# Patient Record
Sex: Male | Born: 1995 | Race: Black or African American | Hispanic: No | Marital: Single | Smoking: Current every day smoker
Health system: Southern US, Community
[De-identification: ages and names within clinical notes are randomized; demographics above are authoritative.]

## PROBLEM LIST (undated history)

## (undated) DIAGNOSIS — F4310 Post-traumatic stress disorder, unspecified: Secondary | ICD-10-CM

## (undated) DIAGNOSIS — R569 Unspecified convulsions: Secondary | ICD-10-CM

## (undated) DIAGNOSIS — F3481 Disruptive mood dysregulation disorder: Secondary | ICD-10-CM

## (undated) DIAGNOSIS — F70 Mild intellectual disabilities: Secondary | ICD-10-CM

## (undated) DIAGNOSIS — F919 Conduct disorder, unspecified: Secondary | ICD-10-CM

---
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
## 2016-04-04 ENCOUNTER — Emergency Department
Admission: EM | Admit: 2016-04-04 | Discharge: 2016-04-04 | Disposition: A | Discharge status: Home / Self Care | Payer: Medicaid Other | Attending: Emergency Medicine | Admitting: Emergency Medicine

## 2016-04-04 ENCOUNTER — Encounter: Payer: Self-pay | Admitting: Emergency Medicine

## 2016-04-04 DIAGNOSIS — Y9389 Activity, other specified: Secondary | ICD-10-CM | POA: Insufficient documentation

## 2016-04-04 DIAGNOSIS — S6992XA Unspecified injury of left wrist, hand and finger(s), initial encounter: Secondary | ICD-10-CM | POA: Diagnosis present

## 2016-04-04 DIAGNOSIS — Z91010 Allergy to peanuts: Secondary | ICD-10-CM | POA: Diagnosis not present

## 2016-04-04 DIAGNOSIS — Z79899 Other long term (current) drug therapy: Secondary | ICD-10-CM | POA: Diagnosis not present

## 2016-04-04 DIAGNOSIS — Y999 Unspecified external cause status: Secondary | ICD-10-CM | POA: Diagnosis not present

## 2016-04-04 DIAGNOSIS — Y9289 Other specified places as the place of occurrence of the external cause: Secondary | ICD-10-CM | POA: Insufficient documentation

## 2016-04-04 DIAGNOSIS — S60411A Abrasion of left index finger, initial encounter: Secondary | ICD-10-CM | POA: Insufficient documentation

## 2016-04-04 DIAGNOSIS — W503XXA Accidental bite by another person, initial encounter: Secondary | ICD-10-CM

## 2016-04-04 MED ORDER — BACITRACIN ZINC 500 UNIT/GM EX OINT
TOPICAL_OINTMENT | Freq: Once | CUTANEOUS | Status: AC
  Administered 2016-04-04: 1 via TOPICAL
  Filled 2016-04-04: qty 0.9
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
## 2016-04-04 NOTE — ED Notes (Signed)
NAD noted at time of D/C. Pt's caregiver denies questions or concerns. Pt ambulatory to the lobby at this time.   
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
## 2016-04-04 NOTE — ED Notes (Signed)
Gavin PoundDeborah (caregiver)- [phone]

## 2016-04-17 ENCOUNTER — Emergency Department
Admission: EM | Admit: 2016-04-17 | Discharge: 2016-04-17 | Disposition: A | Discharge status: Home / Self Care | Payer: Medicaid Other | Attending: Emergency Medicine | Admitting: Emergency Medicine

## 2016-04-17 ENCOUNTER — Encounter: Payer: Self-pay | Admitting: Emergency Medicine

## 2016-04-17 DIAGNOSIS — Z79899 Other long term (current) drug therapy: Secondary | ICD-10-CM | POA: Insufficient documentation

## 2016-04-17 DIAGNOSIS — M79605 Pain in left leg: Secondary | ICD-10-CM

## 2016-04-17 MED ORDER — BACLOFEN 10 MG PO TABS: 10.0000 mg | ORAL_TABLET | Freq: Three times a day (TID) | ORAL | 0 refills | Status: AC

## 2016-04-17 NOTE — ED Triage Notes (Addendum)
Patient to ER for c/o left leg/hip pain. Denies any known injury.

## 2016-04-17 NOTE — ED Notes (Signed)

## 2016-04-17 NOTE — ED Provider Notes (Signed)
[facility] Emergency Department Provider Note   ____________________________________________   First MD Initiated Contact with Patient 04/17/16 1155     (approximate)  I have reviewed the triage vital signs and the nursing notes.   HISTORY  Chief Complaint Leg Pain    HPI Michael Pope is a 20 y.o. male presents will nonspecific left groin pain times one day. Patient states he does 50 pushups today and thinks he may have a muscle in his leg. Denies any direct trauma injury or falling.    Past Medical History:  Diagnosis Date  . Conduct disorder   . Disruptive mood dysregulation disorder (HCC)   . Mild intellectual disability   . PTSD (post-traumatic stress disorder)   . Seizures (HCC)     There are no active problems to display for this patient.   History reviewed. No pertinent surgical history.  Prior to Admission medications   Medication Sig Start Date End Date Taking? Authorizing Provider  baclofen (LIORESAL) 10 MG tablet Take 1 tablet (10 mg total) by mouth 3 (three) times daily. 04/17/16   Charmayne Sheerharles M Beers, PA-C  FLUoxetine (PROZAC) 10 MG capsule Take 10 mg by mouth daily.    Historical Provider, MD  GUAIFENESIN PO Take by mouth.    Historical Provider, MD  guanFACINE (TENEX) 2 MG tablet Take 2 mg by mouth 2 (two) times daily.    Historical Provider, MD  HYDROcodone-acetaminophen (NORCO/VICODIN) 5-325 MG tablet Take 1 tablet by mouth every 4 (four) hours as needed. 04/05/15   Hayden Rasmussenavid Mabe, NP  LAMOTRIGINE PO Take 400 mg by mouth 2 (two) times daily.     Historical Provider, MD  ondansetron (ZOFRAN ODT) 4 MG disintegrating tablet Take 1 tablet (4 mg total) by mouth every 8 (eight) hours as needed for nausea or vomiting. 01/27/15   Nani RavensAndrew M Wight, MD  polyethylene glycol-electrolytes (NULYTELY/GOLYTELY) 420 G solution Take 4,000 mLs by mouth once. 01/27/15   Ozella Rocksavid J Merrell, MD  traZODone (DESYREL) 100 MG tablet Take 100 mg by mouth at  bedtime.    Historical Provider, MD    Allergies Almond oil; Fish allergy; Peanuts [peanut oil]; and Pollen extract  No family history on file.  Social History Social History  Substance Use Topics  . Smoking status: Never Smoker  . Smokeless tobacco: Never Used  . Alcohol use No    Review of Systems Constitutional: No fever/chills Eyes: No visual changes. ENT: No sore throat. Cardiovascular: Denies chest pain. Respiratory: Denies shortness of breath. Gastrointestinal: No abdominal pain.  No nausea, no vomiting.  No diarrhea.  No constipation. Genitourinary: Negative for dysuria. Musculoskeletal: Positive for left groin pain. Skin: Negative for rash. Neurological: Negative for headaches, focal weakness or numbness.  10-point ROS otherwise negative.  ____________________________________________   PHYSICAL EXAM:  VITAL SIGNS: ED Triage Vitals  Enc Vitals Group     BP 04/17/16 1138 135/79     Pulse Rate 04/17/16 1138 83     Resp 04/17/16 1138 18     Temp 04/17/16 1138 98.1 F (36.7 C)     Temp Source 04/17/16 1138 Oral     SpO2 04/17/16 1138 100 %     Weight 04/17/16 1139 145 lb (65.8 kg)     Height 04/17/16 1139 5\' 8"  (1.727 m)     Head Circumference --      Peak Flow --      Pain Score 04/17/16 1139 8     Pain Loc --  Pain Edu? --      Excl. in GC? --     Constitutional: Alert and oriented. Well appearing and in no acute distress. Eyes: Conjunctivae are normal. PERRL. EOMI. Head: Atraumatic. Nose: No congestion/rhinnorhea. Mouth/Throat: Mucous membranes are moist.  Oropharynx non-erythematous. Neck: No stridor.   Cardiovascular: Normal rate, regular rhythm. Grossly normal heart sounds.  Good peripheral circulation. Respiratory: Normal respiratory effort.  No retractions. Lungs CTAB. Gastrointestinal: Soft and nontender. No distention. No abdominal bruits. No CVA tenderness. Musculoskeletal: Left leg with straight leg raise is negative. No pelvic  tenderness on a rock. Neurovascularly intact. No ecchymosis or bruising. Neurologic:  Normal speech and language. No gross focal neurologic deficits are appreciated. No gait instability. Skin:  Skin is warm, dry and intact. No rash noted. Psychiatric: Mood and affect are normal. Speech and behavior are normal.  ____________________________________________   LABS (all labs ordered are listed, but only abnormal results are displayed)  Labs Reviewed - No data to display ____________________________________________  EKG   ____________________________________________  RADIOLOGY  Deferred at this visit. ____________________________________________   PROCEDURES  Procedure(s) performed: None  Procedures  Critical Care performed: No  ____________________________________________   INITIAL IMPRESSION / ASSESSMENT AND PLAN / ED COURSE  Pertinent labs & imaging results that were available during my care of the patient were reviewed by me and considered in my medical decision making (see chart for details).  Acute left leg pelvic strain. Rx given for baclofen 10 mg 3 times a day to use as needed for pain and discomfort. Patient follow-up with PCP or return to the ER with any worsening symptomology. Patient voices no other emergency medical complaints at this visit.  Clinical Course      ____________________________________________   FINAL CLINICAL IMPRESSION(S) / ED DIAGNOSES  Final diagnoses:  Left leg pain      NEW MEDICATIONS STARTED DURING THIS VISIT:  Discharge Medication List as of 04/17/2016 12:20 PM    START taking these medications   Details  baclofen (LIORESAL) 10 MG tablet Take 1 tablet (10 mg total) by mouth 3 (three) times daily., Starting Sat 04/17/2016, Print         Note:  This document was prepared using Dragon voice recognition software and may include unintentional dictation errors.   Evangeline Dakinharles M Beers, PA-C 04/17/16 1321    Emily FilbertJonathan E  Williams, MD 04/17/16 [phone]

## 2016-04-21 ENCOUNTER — Emergency Department
Admission: EM | Admit: 2016-04-21 | Discharge: 2016-04-21 | Disposition: A | Discharge status: Home / Self Care | Payer: Medicaid Other | Attending: Emergency Medicine | Admitting: Emergency Medicine

## 2016-04-21 DIAGNOSIS — Z91010 Allergy to peanuts: Secondary | ICD-10-CM | POA: Insufficient documentation

## 2016-04-21 DIAGNOSIS — F4325 Adjustment disorder with mixed disturbance of emotions and conduct: Secondary | ICD-10-CM | POA: Diagnosis not present

## 2016-04-21 DIAGNOSIS — F918 Other conduct disorders: Secondary | ICD-10-CM | POA: Insufficient documentation

## 2016-04-21 DIAGNOSIS — Z79899 Other long term (current) drug therapy: Secondary | ICD-10-CM | POA: Insufficient documentation

## 2016-04-21 DIAGNOSIS — R4689 Other symptoms and signs involving appearance and behavior: Secondary | ICD-10-CM

## 2016-04-21 DIAGNOSIS — Z046 Encounter for general psychiatric examination, requested by authority: Secondary | ICD-10-CM | POA: Diagnosis present

## 2016-04-21 LAB — URINE DRUG SCREEN, QUALITATIVE (ARMC ONLY)
Amphetamines, Ur Screen: NOT DETECTED
Barbiturates, Ur Screen: NOT DETECTED
Benzodiazepine, Ur Scrn: NOT DETECTED
COCAINE METABOLITE, UR ~~LOC~~: NOT DETECTED
Cannabinoid 50 Ng, Ur ~~LOC~~: NOT DETECTED
MDMA (ECSTASY) UR SCREEN: NOT DETECTED
METHADONE SCREEN, URINE: NOT DETECTED
Opiate, Ur Screen: NOT DETECTED
Phencyclidine (PCP) Ur S: NOT DETECTED
TRICYCLIC, UR SCREEN: NOT DETECTED

## 2016-04-21 LAB — COMPREHENSIVE METABOLIC PANEL
ALT: 19 U/L (ref 17–63)
ANION GAP: 6 (ref 5–15)
AST: 26 U/L (ref 15–41)
Albumin: 4.8 g/dL (ref 3.5–5.0)
Alkaline Phosphatase: 103 U/L (ref 38–126)
BUN: 10 mg/dL (ref 6–20)
CHLORIDE: 110 mmol/L (ref 101–111)
CO2: 25 mmol/L (ref 22–32)
Calcium: 9.6 mg/dL (ref 8.9–10.3)
Creatinine, Ser: 0.84 mg/dL (ref 0.61–1.24)
GFR calc non Af Amer: >60 mL/min (ref >60)
Glucose, Bld: 98 mg/dL (ref 65–99)
POTASSIUM: 4.1 mmol/L (ref 3.5–5.1)
SODIUM: 141 mmol/L (ref 135–145)
Total Bilirubin: 0.5 mg/dL (ref 0.3–1.2)
Total Protein: 7.7 g/dL (ref 6.5–8.1)

## 2016-04-21 LAB — CBC
HCT: 40.2 % (ref 40.0–52.0)
HEMOGLOBIN: 13.7 g/dL (ref 13.0–18.0)
MCH: 31 pg (ref 26.0–34.0)
MCHC: 34.1 g/dL (ref 32.0–36.0)
MCV: 90.9 fL (ref 80.0–100.0)
PLATELETS: 202 10*3/uL (ref 150–440)
RBC: 4.43 MIL/uL (ref 4.40–5.90)
RDW: 13 % (ref 11.5–14.5)
WBC: 5.6 10*3/uL (ref 3.8–10.6)

## 2016-04-21 LAB — ACETAMINOPHEN LEVEL

## 2016-04-21 LAB — ETHANOL

## 2016-04-21 LAB — SALICYLATE LEVEL
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
## 2016-04-21 NOTE — ED Notes (Signed)
Pt eating dinner

## 2016-04-21 NOTE — ED Notes (Signed)
Pt dressed out in paper scrubs. Pt belongings including clothing and wallet bagged and labeled by this Clinical research associatewriter and Paulette, NT.

## 2016-04-21 NOTE — Discharge Instructions (Signed)
You have been seen in the Emergency Department (ED)  today for a psychiatric complaint.  You have been evaluated by psychiatry and we believe you are safe to be discharged from the hospital.   ° °Please return to the Emergency Department (ED)  immediately if you have ANY thoughts of hurting yourself or anyone else, so that we may help you. ° °Please avoid alcohol and drug use. ° °Follow up with your doctor and/or therapist as soon as possible regarding today's ED  visit.  ° °You may call crisis hotline for [place] at [phone]. ° °

## 2016-04-21 NOTE — ED Notes (Signed)
Dr clapacs with pt now.   
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
## 2016-05-11 ENCOUNTER — Emergency Department
Admission: EM | Admit: 2016-05-11 | Discharge: 2016-05-12 | Disposition: A | Discharge status: Home / Self Care | Payer: Medicaid Other | Attending: Emergency Medicine | Admitting: Emergency Medicine

## 2016-05-11 ENCOUNTER — Encounter: Payer: Self-pay | Admitting: Emergency Medicine

## 2016-05-11 DIAGNOSIS — F918 Other conduct disorders: Secondary | ICD-10-CM | POA: Insufficient documentation

## 2016-05-11 DIAGNOSIS — Z791 Long term (current) use of non-steroidal anti-inflammatories (NSAID): Secondary | ICD-10-CM | POA: Diagnosis not present

## 2016-05-11 DIAGNOSIS — Z91010 Allergy to peanuts: Secondary | ICD-10-CM | POA: Insufficient documentation

## 2016-05-11 DIAGNOSIS — F919 Conduct disorder, unspecified: Secondary | ICD-10-CM

## 2016-05-11 DIAGNOSIS — Z79899 Other long term (current) drug therapy: Secondary | ICD-10-CM | POA: Diagnosis not present

## 2016-05-11 DIAGNOSIS — R4689 Other symptoms and signs involving appearance and behavior: Secondary | ICD-10-CM

## 2016-05-11 LAB — ETHANOL: Alcohol, Ethyl (B): <5 mg/dL (ref <5)

## 2016-05-11 LAB — CBC WITH DIFFERENTIAL/PLATELET
BASOS ABS: 0.1 10*3/uL (ref 0–0.1)
Basophils Relative: 1 %
EOS PCT: 1 %
Eosinophils Absolute: 0.1 10*3/uL (ref 0–0.7)
HEMATOCRIT: 42 % (ref 40.0–52.0)
HEMOGLOBIN: 14.2 g/dL (ref 13.0–18.0)
LYMPHS ABS: 2.2 10*3/uL (ref 1.0–3.6)
LYMPHS PCT: 30 %
MCH: 30.6 pg (ref 26.0–34.0)
MCHC: 33.8 g/dL (ref 32.0–36.0)
MCV: 90.4 fL (ref 80.0–100.0)
Monocytes Absolute: 0.6 10*3/uL (ref 0.2–1.0)
Monocytes Relative: 8 %
NEUTROS ABS: 4.4 10*3/uL (ref 1.4–6.5)
NEUTROS PCT: 60 %
Platelets: 222 10*3/uL (ref 150–440)
RBC: 4.64 MIL/uL (ref 4.40–5.90)
RDW: 13 % (ref 11.5–14.5)
WBC: 7.4 10*3/uL (ref 3.8–10.6)

## 2016-05-11 LAB — COMPREHENSIVE METABOLIC PANEL
ALT: 51 U/L (ref 17–63)
AST: 44 U/L — AB (ref 15–41)
Albumin: 5.2 g/dL — ABNORMAL HIGH (ref 3.5–5.0)
Alkaline Phosphatase: 107 U/L (ref 38–126)
Anion gap: 7 (ref 5–15)
BUN: 9 mg/dL (ref 6–20)
CHLORIDE: 104 mmol/L (ref 101–111)
CO2: 27 mmol/L (ref 22–32)
CREATININE: 0.8 mg/dL (ref 0.61–1.24)
Calcium: 9.6 mg/dL (ref 8.9–10.3)
GFR calc non Af Amer: >60 mL/min (ref >60)
Glucose, Bld: 94 mg/dL (ref 65–99)
POTASSIUM: 3.5 mmol/L (ref 3.5–5.1)
SODIUM: 138 mmol/L (ref 135–145)
Total Bilirubin: 0.2 mg/dL — ABNORMAL LOW (ref 0.3–1.2)
Total Protein: 7.8 g/dL (ref 6.5–8.1)

## 2016-05-11 LAB — URINE DRUG SCREEN, QUALITATIVE (ARMC ONLY)
AMPHETAMINES, UR SCREEN: NOT DETECTED
Barbiturates, Ur Screen: NOT DETECTED
Benzodiazepine, Ur Scrn: NOT DETECTED
CANNABINOID 50 NG, UR ~~LOC~~: NOT DETECTED
COCAINE METABOLITE, UR ~~LOC~~: NOT DETECTED
MDMA (ECSTASY) UR SCREEN: NOT DETECTED
Methadone Scn, Ur: NOT DETECTED
OPIATE, UR SCREEN: NOT DETECTED
PHENCYCLIDINE (PCP) UR S: NOT DETECTED
Tricyclic, Ur Screen: NOT DETECTED

## 2016-05-11 NOTE — ED Triage Notes (Signed)
Pt presents to ED accompanied by group home staff with concerns about pt threatening, and acting aggressively with multiple angry outburst the past 2 days. Pt has been a this group home for the past 6 weeks with similar outburst reported. Pt alert and calm at this time and states he may need a medication change to help control his anger.
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
## 2016-05-12 LAB — ACETAMINOPHEN LEVEL

## 2016-05-12 LAB — SALICYLATE LEVEL: Salicylate Lvl: <7 mg/dL (ref 2.8–30.0)

## 2016-05-12 MED ORDER — FLUOXETINE HCL 20 MG PO CAPS: 20.0000 mg | ORAL_CAPSULE | Freq: Every day | ORAL | 0 refills | Status: AC

## 2016-05-12 MED ORDER — FLUOXETINE HCL 20 MG PO CAPS
20.0000 mg | ORAL_CAPSULE | Freq: Once | ORAL | Status: AC
  Administered 2016-05-12: 20 mg via ORAL

## 2016-05-12 MED ORDER — FLUOXETINE HCL 20 MG PO CAPS
ORAL_CAPSULE | ORAL | Status: AC
  Filled 2016-05-12: qty 1
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
## 2016-05-12 NOTE — ED Notes (Signed)
Group home is on the way to pick up pt. Discharge paperwork reviewed with pt.  Maintained on 15 minute checks and observation by security camera for safety.

## 2016-05-12 NOTE — ED Notes (Signed)
Pt dressing for discharge. Belongings searched prior to being given to pt.
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
## 2016-05-12 NOTE — ED Notes (Signed)
SOC consult completed  
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
## 2016-05-12 NOTE — ED Notes (Signed)
Pt discharged to group home. All belongings returned to pt.

## 2016-05-12 NOTE — ED Notes (Signed)
Pt laying in bed upon approach. Pt presents with sad affect. Denies SI/HI and AVH. Denies pain. Pt made aware of plans to discharge him back to group home. Pt accepting. No concerns voiced. Maintained on 15 minute checks and observation by security camera for safety.

## 2016-05-12 NOTE — ED Notes (Signed)
Patient assigned to appropriate care area. Patient oriented to unit/care area: Informed that, for their safety, care areas are designed for safety and monitored by security cameras at all times; and visiting hours explained to patient. Patient verbalizes understanding, and verbal contract for safety obtained. 

## 2016-05-12 NOTE — Discharge Instructions (Signed)
You were seen by tele-psychiatry who recommends increasing Prozac to 20 mg daily. Return to the ER for worsening symptoms, feelings of hurting herself or others, or other concerns.

## 2016-05-27 ENCOUNTER — Encounter: Payer: Self-pay | Admitting: Emergency Medicine

## 2016-05-27 ENCOUNTER — Emergency Department
Admission: EM | Admit: 2016-05-27 | Discharge: 2016-05-28 | Disposition: A | Discharge status: Home / Self Care | Payer: Medicaid Other | Attending: Student in an Organized Health Care Education/Training Program | Admitting: Student in an Organized Health Care Education/Training Program

## 2016-05-27 DIAGNOSIS — Z5181 Encounter for therapeutic drug level monitoring: Secondary | ICD-10-CM | POA: Insufficient documentation

## 2016-05-27 DIAGNOSIS — R4689 Other symptoms and signs involving appearance and behavior: Secondary | ICD-10-CM

## 2016-05-27 DIAGNOSIS — Z046 Encounter for general psychiatric examination, requested by authority: Secondary | ICD-10-CM | POA: Diagnosis present

## 2016-05-27 DIAGNOSIS — F918 Other conduct disorders: Secondary | ICD-10-CM | POA: Diagnosis not present

## 2016-05-27 LAB — CBC
HCT: 41.4 % (ref 40.0–52.0)
Hemoglobin: 14 g/dL (ref 13.0–18.0)
MCH: 30.7 pg (ref 26.0–34.0)
MCHC: 33.8 g/dL (ref 32.0–36.0)
MCV: 90.7 fL (ref 80.0–100.0)
PLATELETS: 213 10*3/uL (ref 150–440)
RBC: 4.56 MIL/uL (ref 4.40–5.90)
RDW: 12.8 % (ref 11.5–14.5)
WBC: 8.4 10*3/uL (ref 3.8–10.6)

## 2016-05-27 LAB — URINE DRUG SCREEN, QUALITATIVE (ARMC ONLY)
Amphetamines, Ur Screen: NOT DETECTED
BARBITURATES, UR SCREEN: NOT DETECTED
Benzodiazepine, Ur Scrn: NOT DETECTED
CANNABINOID 50 NG, UR ~~LOC~~: NOT DETECTED
Cocaine Metabolite,Ur ~~LOC~~: NOT DETECTED
MDMA (Ecstasy)Ur Screen: NOT DETECTED
METHADONE SCREEN, URINE: NOT DETECTED
Opiate, Ur Screen: NOT DETECTED
Phencyclidine (PCP) Ur S: NOT DETECTED
TRICYCLIC, UR SCREEN: NOT DETECTED

## 2016-05-27 LAB — COMPREHENSIVE METABOLIC PANEL
ALBUMIN: 5.2 g/dL — AB (ref 3.5–5.0)
ALK PHOS: 99 U/L (ref 38–126)
ALT: 24 U/L (ref 17–63)
ANION GAP: 7 (ref 5–15)
AST: 27 U/L (ref 15–41)
BILIRUBIN TOTAL: 0.7 mg/dL (ref 0.3–1.2)
BUN: 9 mg/dL (ref 6–20)
CALCIUM: 9.7 mg/dL (ref 8.9–10.3)
CO2: 26 mmol/L (ref 22–32)
Chloride: 107 mmol/L (ref 101–111)
Creatinine, Ser: 0.99 mg/dL (ref 0.61–1.24)
GFR calc non Af Amer: >60 mL/min (ref >60)
GLUCOSE: 105 mg/dL — AB (ref 65–99)
POTASSIUM: 4.1 mmol/L (ref 3.5–5.1)
Sodium: 140 mmol/L (ref 135–145)
TOTAL PROTEIN: 7.7 g/dL (ref 6.5–8.1)

## 2016-05-27 LAB — ETHANOL

## 2016-05-27 LAB — SALICYLATE LEVEL

## 2016-05-27 LAB — ACETAMINOPHEN LEVEL

## 2016-05-27 NOTE — ED Notes (Addendum)
ACSO to check group home to come get pt. D/t Group Home refusing to pick up pt for d/c.

## 2016-05-27 NOTE — Discharge Instructions (Signed)
Please follow up with [facility].  Return for any concerns or questions.
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
## 2016-05-27 NOTE — ED Notes (Signed)
Kennedy Buckerhanh, EDT in triage for protocols and to change pt into behav scrubs
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
## 2016-05-28 NOTE — ED Notes (Signed)
Pt. Verbalizes understanding of d/c instructions and follow-up. VS stable and pain controlled per pt.  Pt. In NAD at time of d/c and denies further concerns regarding this visit. Pt. Stable at the time of departure from the unit, departing unit by the safest and most appropriate manner per that pt condition and limitations. Pt advised to return to the ED at any time for emergent concerns, or for new/worsening symptoms.   

## 2016-05-28 NOTE — ED Notes (Addendum)
Pt given clothing to change for discharge per Rn

## 2016-05-28 NOTE — ED Notes (Signed)
Patient discharged to home per MD order. Patient in stable condition, and deemed medically cleared by ED provider for discharge. Discharge instructions reviewed with patient/family using "Teach Back"; verbalized understanding of medication education and administration, and information about follow-up care. Denies further concerns. ° °

## 2016-07-15 ENCOUNTER — Encounter: Payer: Self-pay | Admitting: *Deleted

## 2016-07-15 ENCOUNTER — Emergency Department: Payer: Medicaid Other

## 2016-07-15 ENCOUNTER — Emergency Department
Admission: EM | Admit: 2016-07-15 | Discharge: 2016-07-16 | Disposition: A | Discharge status: Home / Self Care | Payer: Medicaid Other | Attending: Emergency Medicine | Admitting: Emergency Medicine

## 2016-07-15 DIAGNOSIS — G40909 Epilepsy, unspecified, not intractable, without status epilepticus: Secondary | ICD-10-CM | POA: Diagnosis not present

## 2016-07-15 DIAGNOSIS — Z79899 Other long term (current) drug therapy: Secondary | ICD-10-CM | POA: Insufficient documentation

## 2016-07-15 DIAGNOSIS — F445 Conversion disorder with seizures or convulsions: Secondary | ICD-10-CM

## 2016-07-15 LAB — COMPREHENSIVE METABOLIC PANEL
ALBUMIN: 4.9 g/dL (ref 3.5–5.0)
ALT: 23 U/L (ref 17–63)
ANION GAP: 8 (ref 5–15)
AST: 28 U/L (ref 15–41)
Alkaline Phosphatase: 100 U/L (ref 38–126)
BUN: 10 mg/dL (ref 6–20)
CALCIUM: 9.2 mg/dL (ref 8.9–10.3)
CO2: 24 mmol/L (ref 22–32)
CREATININE: 1.04 mg/dL (ref 0.61–1.24)
Chloride: 107 mmol/L (ref 101–111)
GFR calc Af Amer: >60 mL/min (ref >60)
GFR calc non Af Amer: >60 mL/min (ref >60)
GLUCOSE: 108 mg/dL — AB (ref 65–99)
Potassium: 3.4 mmol/L — ABNORMAL LOW (ref 3.5–5.1)
SODIUM: 139 mmol/L (ref 135–145)
Total Bilirubin: 0.8 mg/dL (ref 0.3–1.2)
Total Protein: 7.6 g/dL (ref 6.5–8.1)

## 2016-07-15 LAB — CBC WITH DIFFERENTIAL/PLATELET
BASOS ABS: 0.1 10*3/uL (ref 0–0.1)
BASOS PCT: 1 %
EOS ABS: 0.1 10*3/uL (ref 0–0.7)
Eosinophils Relative: 1 %
HCT: 37.8 % — ABNORMAL LOW (ref 40.0–52.0)
HEMOGLOBIN: 13.3 g/dL (ref 13.0–18.0)
Lymphocytes Relative: 26 %
Lymphs Abs: 2.1 10*3/uL (ref 1.0–3.6)
MCH: 30.9 pg (ref 26.0–34.0)
MCHC: 35.3 g/dL (ref 32.0–36.0)
MCV: 87.5 fL (ref 80.0–100.0)
Monocytes Absolute: 0.7 10*3/uL (ref 0.2–1.0)
Monocytes Relative: 9 %
NEUTROS PCT: 63 %
Neutro Abs: 5.1 10*3/uL (ref 1.4–6.5)
Platelets: 187 10*3/uL (ref 150–440)
RBC: 4.33 MIL/uL — AB (ref 4.40–5.90)
RDW: 12.6 % (ref 11.5–14.5)
WBC: 8.1 10*3/uL (ref 3.8–10.6)

## 2016-07-15 LAB — ETHANOL: Alcohol, Ethyl (B): <5 mg/dL (ref <5)

## 2016-07-15 NOTE — ED Triage Notes (Signed)
Pt brought in via ems from [facility] group home.  Pt reportedly had a seizure 2 weeks ago and again tonight lasting 20 minutes.   On arrival to er treatment room pt awake and alert.  md at bedside.  Iv fluids infusing.

## 2016-07-15 NOTE — ED Provider Notes (Addendum)
[facility] Emergency Department Provider Note        Time seen: ----------------------------------------- 9:11 PM on 07/15/2016 -----------------------------------------    I have reviewed the triage vital signs and the nursing notes.   HISTORY  Chief Complaint Seizures    HPI Michael Pope is a 21 y.o. male who presents to ER being brought from a group home for possible seizure. Patient reportedly had a seizure like event 2 weeks ago and again tonight lasted around 20 minutes. On arrivalpatient was awake and alert. He denies any complaints at this time.   Past Medical History:  Diagnosis Date  . Conduct disorder   . Disruptive mood dysregulation disorder (HCC)   . Mild intellectual disability   . PTSD (post-traumatic stress disorder)   . Seizures [facility](HCC)     Patient Active Problem List   Diagnosis Date Noted  . 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  Allergies Almond oil; Fish allergy; Peanuts [peanut oil]; and Pollen extract  Social History Social History  Substance Use Topics  . Smoking status: Never Smoker  . Smokeless tobacco: Never Used  . Alcohol use No    Review of Systems Constitutional: Negative for fever. Cardiovascular: Negative for chest pain. Respiratory: Negative for shortness of breath. Gastrointestinal: Negative for abdominal pain, vomiting and diarrhea. Skin: Negative for rash. Neurological: Negative for headaches, focal weakness or numbness.Positive for possible seizure  10-point ROS otherwise negative.  ____________________________________________   PHYSICAL EXAM:  VITAL SIGNS: ED Triage Vitals  Enc Vitals Group     BP      Pulse      Resp      Temp      Temp src      SpO2      Weight      Height      Head Circumference      Peak Flow      Pain Score      Pain Loc      Pain Edu?      Excl. in GC?      Constitutional: Alert and oriented. No distress Eyes: Conjunctivae are normal. PERRL. Normal extraocular movements. ENT   Head: Normocephalic and atraumatic.   Nose: No congestion/rhinnorhea.   Mouth/Throat: Mucous membranes are moist.   Neck: No stridor. Cardiovascular: Normal rate, regular rhythm. No murmurs, rubs, or gallops. Respiratory: Normal respiratory effort without tachypnea nor retractions. Breath sounds are clear and equal bilaterally. No wheezes/rales/rhonchi. Gastrointestinal: Soft and nontender. Normal bowel sounds Musculoskeletal: Nontender with normal range of motion in all extremities. No lower extremity tenderness nor edema. Neurologic:  Normal speech and language. No gross focal neurologic deficits are appreciated. Voluntary shaking noted Skin:  Skin is warm, dry and intact. No rash noted. Psychiatric: Mood and affect are normal.  ____________________________________________  EKG: Interpreted by me. Sinus rhythm rate 86 bpm, normal PR interval, normal QRS, normal QT, normal axis.  ____________________________________________  ED COURSE:  Pertinent labs & imaging results that were available during my care of the patient were reviewed by me and considered in my medical decision making (see chart for details). Patient presents to the ER in no distress with likely pseudoseizure. We will assess with labs and imaging.   Procedures ____________________________________________   LABS (pertinent positives/negatives)  Labs Reviewed  CBC WITH DIFFERENTIAL/PLATELET - Abnormal; Notable for the following:       Result Value   RBC 4.33 (*)  HCT 37.8 (*)    All other components within normal limits  COMPREHENSIVE METABOLIC PANEL - Abnormal; Notable for the following:    Potassium 3.4 (*)    Glucose, Bld 108 (*)    All other components within normal limits  ETHANOL  URINE DRUG SCREEN, QUALITATIVE ([facility] ONLY)  CBG MONITORING, ED    RADIOLOGY Images  were viewed by me  CT head IMPRESSION: No acute intracranial abnormality noted. ____________________________________________  FINAL ASSESSMENT AND PLAN  Pseudoseizure  Plan: Patient with labs and imaging as dictated above. Patient noted to have multiple pseudoseizures prehospital and here. These resolve with painful stimuli without any postictal period. He is medically cleared for outpatient follow-up. He is set to go to a new group home in 24 hours.   Emily Filbert, MD   Note: This note was generated in part or whole with voice recognition software. Voice recognition is usually quite accurate but there are transcription errors that can and very often do occur. I apologize for any typographical errors that were not detected and corrected.     Emily Filbert, MD 07/15/16 2204    Emily Filbert, MD 07/15/16 [phone]

## 2016-07-16 LAB — URINE DRUG SCREEN, QUALITATIVE (ARMC ONLY)
Amphetamines, Ur Screen: NOT DETECTED
BENZODIAZEPINE, UR SCRN: POSITIVE — AB
Barbiturates, Ur Screen: NOT DETECTED
COCAINE METABOLITE, UR ~~LOC~~: NOT DETECTED
Cannabinoid 50 Ng, Ur ~~LOC~~: NOT DETECTED
MDMA (ECSTASY) UR SCREEN: NOT DETECTED
METHADONE SCREEN, URINE: NOT DETECTED
OPIATE, UR SCREEN: NOT DETECTED
PHENCYCLIDINE (PCP) UR S: NOT DETECTED
Tricyclic, Ur Screen: POSITIVE — AB

## 2016-07-16 NOTE — ED Notes (Signed)
[name] returned phone call at this time. She again stated that the legal guardian was responsible for the pt "because he was discharged from our facility". She suggested that I keep trying the guardian's phone until contact could be made. This RN explained to GaltBrenda that solving communication/logistic issues between the group home and guardian was not within the ED's responsibilities, and that the pt would need to be d/c'd back to the same place that EMS had picked him up. [name] gave this RN the Group Home director's phone number Jodi Geralds(John Graves [phone] at this time and told me I would have to address any further issues or concerns regarding d/c and disposition with him.

## 2016-07-16 NOTE — ED Notes (Signed)
Called [facility] [phone]) and spoke with [name] regarding pt's need for pick-up at d/c. She stated she would need to make a phone call to arrange transportation and would call back as soon as she had that information available.
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
## 2016-07-16 NOTE — ED Notes (Signed)
No answer at either phone number for the legal guardian or the group home supervisor, as provided by [name] from the group home. Returned phone call to [name] and informed her that d/t being unable to contact either person responsible for the pt to arrange a proper discharge/disposition, the pt would be provided a taxi cab voucher back to the facility at this time. [name] verbally acknowledged such, and stated someone would be available to accept the pt upon his arrival to their facility.

## 2016-07-16 NOTE — ED Notes (Addendum)
[name], from [facility], returned phone call regarding pt's d/c issues. She stated that the pt was "discharged from their facility yesterday and was supposed to be picked up by his guardian, but never was". She stated we would need to call his legal guardian to arrange for pickup and transportation. This RN called the number given by [name] [facility](Chavis Gash [phone]) and there was no answer. This RN again called [name] at the group Home and explained that sending the pt to the ED simply because the pt's guardian had failed to arrive to take the pt elsewhere was an unacceptable abuse of ED resources, and that they were still responsible for the pt until such care had been assumed by the guardian. She stated she would call the Group Home director at this time and would call back with any new information regarding pt's d/c and disposition.

## 2016-08-20 ENCOUNTER — Emergency Department
Admission: EM | Admit: 2016-08-20 | Discharge: 2016-08-20 | Disposition: A | Discharge status: Home / Self Care | Payer: Medicaid Other | Attending: Emergency Medicine | Admitting: Emergency Medicine

## 2016-08-20 ENCOUNTER — Encounter: Payer: Self-pay | Admitting: Emergency Medicine

## 2016-08-20 DIAGNOSIS — R569 Unspecified convulsions: Secondary | ICD-10-CM | POA: Diagnosis present

## 2016-08-20 DIAGNOSIS — G40909 Epilepsy, unspecified, not intractable, without status epilepticus: Secondary | ICD-10-CM | POA: Diagnosis not present

## 2016-08-20 NOTE — ED Triage Notes (Signed)
Had witnessed tonic clonic seizure at group home today lasting 2 min. Reports takes all meds. Alert and oriented now.

## 2016-08-20 NOTE — Discharge Instructions (Signed)
Please make sure you keep your follow-up appointment to establish care with a neurologist as are the schedule. Return to the emergency department for any concerns.  It was a pleasure to take care of you today, and thank you for coming to our emergency department.  If you have any questions or concerns before leaving please ask the nurse to grab me and I'm more than happy to go through your aftercare instructions again.  If you were prescribed any opioid pain medication today such as Norco, Vicodin, Percocet, morphine, hydrocodone, or oxycodone please make sure you do not drive when you are taking this medication as it can alter your ability to drive safely.  If you have any concerns once you are home that you are not improving or are in fact getting worse before you can make it to your follow-up appointment, please do not hesitate to call [phone] and come back for further evaluation.  Merrily Brittle MD

## 2016-08-20 NOTE — ED Notes (Signed)
Group home on way to get pt.

## 2016-08-20 NOTE — ED Provider Notes (Signed)
[facility] Emergency Department Provider Note  ____________________________________________   First MD Initiated Contact with Patient 08/20/16 [phone]     (approximate)  I have reviewed the triage vital signs and the nursing notes.   HISTORY  Chief Complaint Seizures    HPI Michael Pope is a 21 y.o. male who is brought to the emergency department via EMS against his will after having a possible generalized tonic-clonic seizure today. The patient says he has a history of seizures but is never been evaluated by neurologist and was diagnosed instead by his therapist. He says that he feels fine and would not of come to the hospital if he didn't think it were required. He's had no recent illnesses and he reports compliance with unknown medications. He wants to go home.   Past Medical History:  Diagnosis Date  . Conduct disorder   . Disruptive mood dysregulation disorder (HCC)   . Mild intellectual disability   . PTSD (post-traumatic stress disorder)   . Seizures [facility])     Patient Active Problem List   Diagnosis Date Noted  . 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 No pertinent surgical history.  Prior to Admission medications   Medication Sig Start Date End Date Taking? Authorizing Provider  baclofen (LIORESAL) 10 MG tablet Take 1 tablet (10 mg total) by mouth 3 (three) times daily. 04/17/16   Charmayne Sheer Beers, PA-C  FLUoxetine (PROZAC) 20 MG capsule Take 1 capsule (20 mg total) by mouth daily. 05/12/16   Irean Hong, MD  GUAIFENESIN PO Take by mouth.    Historical Provider, MD  guanFACINE (TENEX) 2 MG tablet Take 2 mg by mouth 2 (two) times daily.    Historical Provider, MD  HYDROcodone-acetaminophen (NORCO/VICODIN) 5-325 MG tablet Take 1 tablet by mouth every 4 (four) hours as needed. 04/05/15   Hayden Rasmussen, NP  LAMOTRIGINE PO Take 400 mg by mouth 2 (two) times daily.     Historical  Provider, MD  ondansetron (ZOFRAN ODT) 4 MG disintegrating tablet Take 1 tablet (4 mg total) by mouth every 8 (eight) hours as needed for nausea or vomiting. 01/27/15   Nani Ravens, MD  polyethylene glycol-electrolytes (NULYTELY/GOLYTELY) 420 G solution Take 4,000 mLs by mouth once. 01/27/15   Ozella Rocks, MD  traZODone (DESYREL) 100 MG tablet Take 100 mg by mouth at bedtime.    Historical Provider, MD    Allergies Almond oil; Fish allergy; Peanuts [peanut oil]; and Pollen extract  History reviewed. No pertinent family history.  Social History Social History  Substance Use Topics  . Smoking status: Never Smoker  . Smokeless tobacco: Never Used  . Alcohol use No    Review of Systems Constitutional: No fever/chills Eyes: No visual changes. ENT: No sore throat. Cardiovascular: Denies chest pain. Respiratory: Denies shortness of breath. Gastrointestinal: No abdominal pain.  No nausea, no vomiting.  No diarrhea.  No constipation. Genitourinary: Negative for dysuria. Musculoskeletal: Negative for back pain. Skin: Negative for rash. Neurological: Negative for headaches, focal weakness or numbness.  10-point ROS otherwise negative.  ____________________________________________   PHYSICAL EXAM:  VITAL SIGNS: ED Triage Vitals  Enc Vitals Group     BP 08/20/16 0844 131/65     Pulse Rate 08/20/16 0844 100     Resp 08/20/16 0844 18     Temp 08/20/16 0844 98.7 F (37.1 C)     Temp Source 08/20/16 0844 Oral  SpO2 08/20/16 0844 97 %     Weight 08/20/16 0835 170 lb (77.1 kg)     Height 08/20/16 0835  (1.727 m)     Head Circumference --      Peak Flow --      Pain Score --      Pain Loc --      Pain Edu? --      Excl. in GC? --     Constitutional: Alert and oriented x 4 well appearing nontoxic no diaphoresis speaks in full, clear sentences Eyes: PERRL EOMI. Head: Atraumatic. Nose: No congestion/rhinnorhea. Mouth/Throat: No Bites or lacerations to his  tongue Neck: No stridor.   Cardiovascular: Normal rate, regular rhythm. Grossly normal heart sounds.  Good peripheral circulation. Respiratory: Normal respiratory effort.  No retractions. Lungs CTAB and moving good air Gastrointestinal: Soft nondistended nontender no rebound no guarding no peritonitis no McBurney's tenderness negative Rovsing's no costovertebral tenderness negative Murphy's Musculoskeletal: No lower extremity edema   Neurologic:  Cranial nerves II through XII intact No pronator drift 5 out of 5 biceps triceps grips hip flexion and hip extension plantar flexion dorsiflexion 2+ DTRs no ankle clonus sensation intact to light touch throughout normal finger-nose-finger Skin:  Skin is warm, dry and intact. No rash noted. Psychiatric: Mood and affect are normal. Speech and behavior are normal.    ____________________________________________   DIFFERENTIAL  Psychogenic nonepileptic seizure, seizure, ventricular tachycardia ____________________________________________   LABS (all labs ordered are listed, but only abnormal results are displayed)  Labs Reviewed - No data to display   __________________________________________  EKG  ED ECG REPORT I, Merrily Brittle, the attending physician, personally viewed and interpreted this ECG.  Date: 08/20/2016 Rate: 81 Rhythm: normal sinus rhythm QRS Axis: normal Intervals: normal ST/T Wave abnormalities: normal Conduction Disturbances: none Narrative Interpretation: unremarkable  ____________________________________________  RADIOLOGY   ____________________________________________   PROCEDURES  Procedure(s) performed: no  Procedures  Critical Care performed: no  ____________________________________________   INITIAL IMPRESSION / ASSESSMENT AND PLAN / ED COURSE  Pertinent labs & imaging results that were available during my care of the patient were reviewed by me and considered in my medical decision making  (see chart for details).  The patient arrives hemodynamically stable and very well-appearing. On chart review he has no clear history of ever being diagnosed with seizure disorder and he has never seen a neurologist. 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 Regardless he is back to his baseline well-appearing and alert he has follow-up established end no longer wants any further care which I think is entirely reasonable. He is discharged home in improved condition.      ____________________________________________   FINAL CLINICAL IMPRESSION(S) / ED DIAGNOSES  Final diagnoses:  Seizure (HCC)      NEW MEDICATIONS STARTED DURING THIS VISIT:  Discharge Medication List as of 08/20/2016  8:53 AM       Note:  This document was prepared using Dragon voice recognition software and may include unintentional dictation errors.     Merrily Brittle, MD 08/21/16 1451

## 2016-09-08 DIAGNOSIS — R569 Unspecified convulsions: Secondary | ICD-10-CM | POA: Insufficient documentation

## 2016-09-13 ENCOUNTER — Emergency Department
Admission: EM | Admit: 2016-09-13 | Discharge: 2016-09-13 | Disposition: A | Discharge status: Home / Self Care | Payer: Medicaid Other | Attending: Emergency Medicine | Admitting: Emergency Medicine

## 2016-09-13 DIAGNOSIS — R0781 Pleurodynia: Secondary | ICD-10-CM | POA: Insufficient documentation

## 2016-09-13 DIAGNOSIS — Z79899 Other long term (current) drug therapy: Secondary | ICD-10-CM | POA: Insufficient documentation

## 2016-09-13 NOTE — ED Notes (Signed)
Call to [facility] he will be coming to get pt

## 2016-09-13 NOTE — ED Notes (Signed)
Pt states CP associated with taking a deep breath after playing basketball earlier and the "group home got too excited about it". Pt states resolved at this time.

## 2016-09-13 NOTE — ED Triage Notes (Signed)
Pt brought in via ems from a group home  Pt reports chest pain that started 30 minutes, no pain on arrival to triage.  No n/v/d  nonradiating pain.  No sob.   Pt states under a lot of stress and is constantly yelled at by staff at group home.  Pt alert  Speech clear.

## 2016-09-13 NOTE — ED Provider Notes (Signed)
[facility] Emergency Department Provider Note  ____________________________________________  Time seen: Approximately 9:57 PM  I have reviewed the triage vital signs and the nursing notes.   HISTORY  Chief Complaint Chest Pain    HPI Michael Pope is a 21 y.o. male presenting to the emergency department with pleuritic chest pain that started hours ago. Patient states that he was playing basketball and states that for a few seconds "it hurt when he took a deep breath". Patient states that he told his group home supervisor. Patient states that chest pain quickly dissipated. Patient states that he has not experienced similar episodes of chest pain in the past. Patient denies a history of cardiac issues. He denies current chest pain current, current chest tightness, nausea, vomiting or abdominal pain. Patient states that when he experienced chest pain earlier this evening, he "felt a little bit anxious". Patient states that he has a history of anxiety. Patient states that he is ready to go home and "feels fine". Patient denies the use of illicit drugs. No alleviating measures have been undertaken. Patient denies cough, congestion, rhinorrhea or history of asthma.  Past Medical History:  Diagnosis Date  . Conduct disorder   . Disruptive mood dysregulation disorder (HCC)   . Mild intellectual disability   . PTSD (post-traumatic stress disorder)   . Seizures [facility])     Patient Active Problem List   Diagnosis Date Noted  . 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  Prior to Admission medications   Medication Sig Start Date End Date Taking? Authorizing Provider  baclofen (LIORESAL) 10 MG tablet Take 1 tablet (10 mg total) by mouth 3 (three) times daily. 04/17/16   Charmayne Sheer Beers, PA-C  FLUoxetine (PROZAC) 20 MG capsule Take 1 capsule (20 mg total) by mouth daily. 05/12/16   Irean Hong, MD   GUAIFENESIN PO Take by mouth.    Historical Provider, MD  guanFACINE (TENEX) 2 MG tablet Take 2 mg by mouth 2 (two) times daily.    Historical Provider, MD  HYDROcodone-acetaminophen (NORCO/VICODIN) 5-325 MG tablet Take 1 tablet by mouth every 4 (four) hours as needed. 04/05/15   Hayden Rasmussen, NP  LAMOTRIGINE PO Take 400 mg by mouth 2 (two) times daily.     Historical Provider, MD  ondansetron (ZOFRAN ODT) 4 MG disintegrating tablet Take 1 tablet (4 mg total) by mouth every 8 (eight) hours as needed for nausea or vomiting. 01/27/15   Nani Ravens, MD  polyethylene glycol-electrolytes (NULYTELY/GOLYTELY) 420 G solution Take 4,000 mLs by mouth once. 01/27/15   Ozella Rocks, MD  traZODone (DESYREL) 100 MG tablet Take 100 mg by mouth at bedtime.    Historical Provider, MD    Allergies Almond oil; Fish allergy; Peanuts [peanut oil]; and Pollen extract  No family history on file.  Social History Social History  Substance Use Topics  . Smoking status: Never Smoker  . Smokeless tobacco: Never Used  . Alcohol use No     Review of Systems  Constitutional: No fever/chills Eyes: No visual changes. No discharge ENT: No upper respiratory complaints. Cardiovascular: Patient had an episode of pleuritic chest pain. Respiratory: no cough. No SOB. Gastrointestinal: No abdominal pain.  No nausea, no vomiting.  No diarrhea.  No constipation. Musculoskeletal: Negative for musculoskeletal pain. Skin: Negative for rash, abrasions, lacerations, ecchymosis. Neurological: Negative for headaches, focal weakness or numbness. ____________________________________________   PHYSICAL EXAM:  VITAL SIGNS: ED Triage Vitals  Enc Vitals Group     BP 09/13/16 1944 123/65     Pulse Rate 09/13/16 1944 60     Resp 09/13/16 1944 18     Temp 09/13/16 1944 98.5 F (36.9 C)     Temp Source 09/13/16 1944 Oral     SpO2 09/13/16 1944 100 %     Weight 09/13/16 1946 150 lb (68 kg)     Height 09/13/16 1946   (1.778 m)     Head Circumference --      Peak Flow --      Pain Score --      Pain Loc --      Pain Edu? --      Excl. in GC? --      Constitutional: Alert and oriented. Well appearing and in no acute distress. Eyes: Conjunctivae are normal. PERRL. EOMI. Head: Atraumatic. ENT:      Ears: Tympanic membranes are pearly bilaterally.      Nose: No congestion/rhinnorhea.      Mouth/Throat: Mucous membranes are moist.  Neck: No stridor. FROM.  Hematological/Lymphatic/Immunilogical: No cervical lymphadenopathy. Cardiovascular: Normal rate, regular rhythm. Normal S1 and S2.  Good peripheral circulation. No reproducible anterior chest wall pain. Respiratory: Normal respiratory effort without tachypnea or retractions. Lungs CTAB. Good air entry to the bases with no decreased or absent breath sounds. Gastrointestinal: Bowel sounds 4 quadrants. Soft and nontender to palpation. No guarding or rigidity. No palpable masses. No distention. No CVA tenderness. Musculoskeletal: Full range of motion to all extremities. No gross deformities appreciated. Neurologic:  Normal speech and language. No gross focal neurologic deficits are appreciated.  Skin:  Skin is warm, dry and intact. No rash noted. Psychiatric: Mood and affect are normal. Speech and behavior are normal. Patient exhibits appropriate insight and judgement.   ____________________________________________   LABS (all labs ordered are listed, but only abnormal results are displayed)  Labs Reviewed - No data to display ____________________________________________  EKG Normal sinus rhythm without ST segment elevation  ____________________________________________  RADIOLOGY   No results found.  ____________________________________________    PROCEDURES  Procedure(s) performed:    Procedures    Medications - No data to display   ____________________________________________   INITIAL IMPRESSION / ASSESSMENT AND PLAN /  ED COURSE  Pertinent labs & imaging results that were available during my care of the patient were reviewed by me and considered in my medical decision making (see chart for details).  Review of the [place] CSRS was performed in accordance of the [facility] prior to dispensing any controlled drugs.    Assessment and plan: Pleuritic Chest Pain: Patient presents to the emergency department with an episode of momentary pleuritic chest pain. Patient states the chest pain quickly dissipated. EKG conducted in the emergency department reveals normal sinus rhythm without ST segment elevation. Patient denies a history of cardiac issues. Physical exam was reassuring. Strict return precautions were given. All patient questions were answered.   ____________________________________________  FINAL CLINICAL IMPRESSION(S) / ED DIAGNOSES  Final diagnoses:  Pleuritic chest pain      NEW MEDICATIONS STARTED DURING THIS VISIT:  New Prescriptions   No medications on file        This chart was dictated using voice recognition software/Dragon. Despite best efforts to proofread, errors can occur which can change the meaning. Any change was purely unintentional.    Orvil Feil, PA-C 09/13/16 2206    Phineas Semen, MD 09/13/16 2257

## 2016-09-26 ENCOUNTER — Encounter: Payer: Self-pay | Admitting: Emergency Medicine

## 2016-09-26 ENCOUNTER — Emergency Department
Admission: EM | Admit: 2016-09-26 | Discharge: 2016-09-26 | Disposition: A | Discharge status: Home / Self Care | Payer: Medicaid Other | Attending: Emergency Medicine | Admitting: Emergency Medicine

## 2016-09-26 DIAGNOSIS — Z79899 Other long term (current) drug therapy: Secondary | ICD-10-CM | POA: Insufficient documentation

## 2016-09-26 DIAGNOSIS — F4310 Post-traumatic stress disorder, unspecified: Secondary | ICD-10-CM | POA: Insufficient documentation

## 2016-09-26 DIAGNOSIS — R42 Dizziness and giddiness: Secondary | ICD-10-CM | POA: Insufficient documentation

## 2016-09-26 DIAGNOSIS — F919 Conduct disorder, unspecified: Secondary | ICD-10-CM | POA: Diagnosis not present

## 2016-09-26 NOTE — ED Notes (Addendum)
Group home staff member there today:  Mr. Berna SpareMarcus - [phone]

## 2016-09-26 NOTE — ED Triage Notes (Signed)
Pt dropped off by group home. Pt states that when director woke him up at 1600 today he was dizzy for 5 secs when he was getting out of bed. Pt denies being dizzy now and was walking around lobby prior to triage without issue.

## 2016-09-26 NOTE — ED Notes (Signed)
See triage note  States he was dizzy when he got up this afternoon  Did not last very longer  Denies any dizziness at present  Ambulates well to treatment room

## 2016-09-26 NOTE — ED Provider Notes (Signed)
[facility] Emergency Department Provider Note  ____________________________________________  Time seen: Approximately 5:54 PM  I have reviewed the triage vital signs and the nursing notes.   HISTORY  Chief Complaint Dizziness    HPI Michael Pope is a 21 y.o. male with a history of conduct disorder presents to the emergency department with early morning "dizziness". Patient states that he was awoken by the group home director this morning at 5 AM and he experienced approximately 5 seconds of dizziness that resolved without complication. Patient states that he has experienced momentary dizziness when he first wakes up for "as long as I can remember". However, since he mentioned dizziness  to group home director, a formal evaluation at the emergency department was required. Patient denies associated chest pain, chest tightness, shortness of breath, nausea, vomiting and abdominal pain. Patient has not had headache. He is staying hydrated and is drinking primarily water. He has noticed no radiculopathy, weakness or blurry vision. Patient states that he is currently asymptomatic. He has been afebrile. He denies recent illness. No recent trauma. No alleviating measures have been  attempted.  Past Medical History:  Diagnosis Date  . Conduct disorder   . Disruptive mood dysregulation disorder (HCC)   . Mild intellectual disability   . PTSD (post-traumatic stress disorder)   . Seizures [facility](HCC)     Patient Active Problem List   Diagnosis Date Noted  . 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 No pertinent surgical history.  Prior to Admission medications   Medication Sig Start Date End Date Taking? Authorizing Provider  baclofen (LIORESAL) 10 MG tablet Take 1 tablet (10 mg total) by mouth 3 (three) times daily. 04/17/16   Beers, Charmayne Sheerharles M, PA-C  FLUoxetine (PROZAC) 20 MG capsule Take 1 capsule (20 mg total)  by mouth daily. 05/12/16   Irean HongSung, Jade J, MD  GUAIFENESIN PO Take by mouth.    [provider]  guanFACINE (TENEX) 2 MG tablet Take 2 mg by mouth 2 (two) times daily.    [provider]  HYDROcodone-acetaminophen (NORCO/VICODIN) 5-325 MG tablet Take 1 tablet by mouth every 4 (four) hours as needed. 04/05/15   Hayden RasmussenMabe, David, NP  LAMOTRIGINE PO Take 400 mg by mouth 2 (two) times daily.     [provider]  ondansetron (ZOFRAN ODT) 4 MG disintegrating tablet Take 1 tablet (4 mg total) by mouth every 8 (eight) hours as needed for nausea or vomiting. 01/27/15   Nani RavensWight, Andrew M, MD  polyethylene glycol-electrolytes (NULYTELY/GOLYTELY) 420 G solution Take 4,000 mLs by mouth once. 01/27/15   Ozella RocksMerrell, David J, MD  traZODone (DESYREL) 100 MG tablet Take 100 mg by mouth at bedtime.    [provider]    Allergies Almond oil; Fish allergy; Peanuts [peanut oil]; and Pollen extract  History reviewed. No pertinent family history.  Social History Social History  Substance Use Topics  . Smoking status: Never Smoker  . Smokeless tobacco: Never Used  . Alcohol use No     Review of Systems  Constitutional: No fever/chills Eyes: No visual changes. No discharge ENT: No upper respiratory complaints. Cardiovascular: no chest pain. Respiratory: no cough. No SOB. Gastrointestinal: No abdominal pain.  No nausea, no vomiting.  No diarrhea.  No constipation. Genitourinary: Negative for dysuria. No hematuria Musculoskeletal: Negative for musculoskeletal pain. Skin: Negative for rash, abrasions, lacerations, ecchymosis. Neurological: Negative for headaches, focal weakness or numbness. Patient had dizziness.    ____________________________________________  PHYSICAL EXAM:  VITAL SIGNS: ED Triage Vitals  Enc Vitals Group     BP 09/26/16 1724 115/67     Pulse Rate 09/26/16 1724 98     Resp --      Temp 09/26/16 1724 98.2 F (36.8 C)     Temp Source 09/26/16 1724 Oral      SpO2 09/26/16 1724 97 %     Weight 09/26/16 1725 155 lb (70.3 kg)     Height 09/26/16 1725 5\' 10"  (1.778 m)     Head Circumference --      Peak Flow --      Pain Score --      Pain Loc --      Pain Edu? --      Excl. in GC? --     Constitutional: Alert and oriented. Well appearing and in no acute distress. Eyes: Conjunctivae are normal. PERRL. EOMI. Head: Atraumatic. ENT:      Ears: Tympanic membranes are pearly bilaterally.      Nose: No congestion/rhinnorhea.      Mouth/Throat: Mucous membranes are moist.  Neck: No stridor. No cervical spine tenderness to palpation. Hematological/Lymphatic/Immunilogical: No cervical lymphadenopathy.  Cardiovascular: Normal rate, regular rhythm. Normal S1 and S2.  Good peripheral circulation. Respiratory: Normal respiratory effort without tachypnea or retractions. Lungs CTAB. Good air entry to the bases with no decreased or absent breath sounds. Gastrointestinal: Bowel sounds 4 quadrants. Soft and nontender to palpation. No guarding or rigidity. No palpable masses. No distention. No CVA tenderness. Musculoskeletal: Full range of motion to all extremities. No gross deformities appreciated. Neurologic: Normal speech and language. No gross focal neurologic deficits are appreciated. Cranial nerves: 2-10 normal as tested. Cerebellar: Finger-nose-finger WNL, heel to shin WNL. Sensorimotor: No pronator drift, clonus, sensory loss or abnormal reflexes. Vision: No visual field deficts noted to confrontation.  Speech: No dysarthria or expressive aphasia.  Skin:  Skin is warm, dry and intact. No rash noted. Capillary refill less than 3 seconds. Psychiatric: Mood and affect are normal. Speech and behavior are normal. Patient exhibits appropriate insight and judgement.   ____________________________________________   LABS (all labs ordered are listed, but only abnormal results are displayed)  Labs Reviewed - No data to  display ____________________________________________  EKG   ____________________________________________  RADIOLOGY   No results found.  ____________________________________________    PROCEDURES  Procedure(s) performed:    Procedures    Medications - No data to display   ____________________________________________   INITIAL IMPRESSION / ASSESSMENT AND PLAN / ED COURSE  Pertinent labs & imaging results that were available during my care of the patient were reviewed by me and considered in my medical decision making (see chart for details).  Review of the [place] CSRS was performed in accordance of the [facility] prior to dispensing any controlled drugs.     Assessment and plan: Dizziness Patient presents the emergency department with 5 seconds of dizziness today when he awoke at 5:00 AM. Patient states that momentary dizziness is typical for him for "as long as he can remember". Neurologic exam and overall physical exam was reassuring. Patient is not currently symptomatic. Further workup is not warranted at this time. Reassurance was given. Patient was advised to allow for an adjustment time when he first awakens. Strict return precautions were given. Patient voiced understanding regarding these return precautions. Patient was advised to follow-up with primary care as needed. Vital signs were reassuring prior to discharge. All patient questions were answered.   ____________________________________________  FINAL  CLINICAL IMPRESSION(S) / ED DIAGNOSES  Final diagnoses:  Dizziness      NEW MEDICATIONS STARTED DURING THIS VISIT:  New Prescriptions   No medications on file        This chart was dictated using voice recognition software/Dragon. Despite best efforts to proofread, errors can occur which can change the meaning. Any change was purely unintentional.    Orvil Feil, PA-C 09/26/16 1807    Myrna Blazer, MD 09/26/16 [phone]

## 2016-10-15 DIAGNOSIS — G40309 Generalized idiopathic epilepsy and epileptic syndromes, not intractable, without status epilepticus: Secondary | ICD-10-CM | POA: Insufficient documentation

## 2016-12-30 ENCOUNTER — Other Ambulatory Visit: Payer: Self-pay

## 2016-12-31 ENCOUNTER — Ambulatory Visit (INDEPENDENT_AMBULATORY_CARE_PROVIDER_SITE_OTHER): Payer: Medicaid Other | Admitting: Gastroenterology

## 2016-12-31 ENCOUNTER — Telehealth: Payer: Self-pay

## 2016-12-31 ENCOUNTER — Encounter: Payer: Self-pay | Admitting: Gastroenterology

## 2016-12-31 VITALS — BP 109/51 | HR 87 | Temp 98.7°F | Ht 70.0 in | Wt 149.8 lb

## 2016-12-31 DIAGNOSIS — R7989 Other specified abnormal findings of blood chemistry: Secondary | ICD-10-CM

## 2016-12-31 DIAGNOSIS — R945 Abnormal results of liver function studies: Principal | ICD-10-CM

## 2016-12-31 NOTE — Patient Instructions (Addendum)
1. Complete secondary liver disease work up 2. Perform RUQ [place] to evaluate your liver 3. Avoid tylenol, NSAIDs, alcohol  Please call our office to speak with my nurse Iva Lento at [phone] during business hours from 8am to 4pm if you have any questions/concerns. During after hours, you will be redirected to on call GI physician. For any emergency please call 911 or go the nearest emergency room.    Arlyss Repress, MD [address]  [address]  [place], [place] [address]  Main: [phone]  Fax: [phone]

## 2016-12-31 NOTE — Telephone Encounter (Signed)
Pts Caregiver has been given appt for ultrasound at [facility] on Thursday 23rd at 8:45am.  He has informed Jireh should have nothing to eat after midnight the day before.

## 2016-12-31 NOTE — Progress Notes (Signed)
Arlyss Repress, MD [address]  [address]  [place], [place] [address]  Main: [phone]  Fax: [phone]    Gastroenterology Consultation  Referring Provider:     No ref. provider found Primary Care Physician:  Patient, No Pcp Per Primary Gastroenterologist:  Dr. Arlyss Repress Reason for Consultation:    Elevated LFTs        HPI:   Michael Pope is a 21 y.o. y/o male referred for consultation & management Of elevated LFTs. He comes from a group home with an escort not answering questions properly. Upon my chart review, he is found to have mildly elevated AST to 44 on 05/11/2016 and AP elevated to 156 on 10/29/2016. His liver related history as below. He has history of seizures and currently on antiepileptic medications.   First noted abnormality in LFT's in 04/2016.  Alcohol use: never Drug use:  none Over the counter herbal supplements:  none Acetaminophen/NSAIDs: none New medications:  none Tattoos:  none Military service: never Prior blood transfusion: none Incarceration:  never History of travel: none Family history of liver disease/HCC: none Recent change in weight: fluctuates Ultrasound liver: None  Smokes vape He denies taking opioids although it's on his medication list Not working, finished high school, looking into job He is at group home due to some issues at home   Past Medical History:  Diagnosis Date  . Conduct disorder   . Disruptive mood dysregulation disorder (HCC)   . Mild intellectual disability   . PTSD (post-traumatic stress disorder)   . Seizures (HCC)     No past surgical history on file.  Prior to Admission medications   Medication Sig Start Date End Date Taking? Authorizing Provider  baclofen (LIORESAL) 10 MG tablet Take 1 tablet (10 mg total) by mouth 3 (three) times daily. 04/17/16   Beers, Charmayne Sheer, PA-C  EPINEPHrine 0.3 mg/0.3 mL IJ SOAJ injection Inject into the muscle.    [provider]  FLUoxetine  (PROZAC) 20 MG capsule Take 1 capsule (20 mg total) by mouth daily. 05/12/16   Irean Hong, MD  GUAIFENESIN PO Take by mouth.    [provider]  guanFACINE (TENEX) 2 MG tablet Take 2 mg by mouth 2 (two) times daily.    [provider]  HYDROcodone-acetaminophen (NORCO/VICODIN) 5-325 MG tablet Take 1 tablet by mouth every 4 (four) hours as needed. 04/05/15   Hayden Rasmussen, NP  LAMOTRIGINE PO Take 400 mg by mouth 2 (two) times daily.     [provider]  ondansetron (ZOFRAN ODT) 4 MG disintegrating tablet Take 1 tablet (4 mg total) by mouth every 8 (eight) hours as needed for nausea or vomiting. 01/27/15   Nani Ravens, MD  polyethylene glycol-electrolytes (NULYTELY/GOLYTELY) 420 G solution Take 4,000 mLs by mouth once. 01/27/15   Ozella Rocks, MD  prazosin (MINIPRESS) 1 MG capsule Take by mouth.    [provider]  QUEtiapine (SEROQUEL) 100 MG tablet Take by mouth.    [provider]  traZODone (DESYREL) 100 MG tablet Take 100 mg by mouth at bedtime.    [provider]    History reviewed. No pertinent family history.   Social History  Substance Use Topics  . Smoking status: Current Every Day Smoker  . Smokeless tobacco: Current User  . Alcohol use No    Allergies as of 12/31/2016 - Review Complete 09/13/2016  Allergen Reaction Noted  . Almond oil  01/02/2015  . Fish  allergy  01/02/2015  . Other  09/08/2016  . Peanuts [peanut oil]  01/02/2015  . Pollen extract  01/02/2015    Review of Systems:    All systems reviewed and negative except where noted in HPI.   Physical Exam:  BP (!) 109/51   Pulse 87   Temp 98.7 F (37.1 C) (Oral)   Ht 5\' 10"  (1.778 m)   Wt 67.9 kg (149 lb 12.8 oz)   BMI 21.49 kg/m  No LMP for male patient.  General:   Alert,  Well-developed, well-nourished, pleasant and cooperative in NAD Head:  Normocephalic and atraumatic. Eyes:  Sclera clear, no icterus.   Conjunctiva pink. Ears:  Normal  auditory acuity. Nose:  No deformity, discharge, or lesions. Mouth:  No deformity or lesions,oropharynx pink & moist. Neck:  Supple; no masses or thyromegaly. Lungs:  Respirations even and unlabored.  Clear throughout to auscultation.   No wheezes, crackles, or rhonchi. No acute distress. Heart:  Regular rate and rhythm; no murmurs, clicks, rubs, or gallops. Abdomen:  Normal bowel sounds.  No bruits.  Soft, non-tender and non-distended without masses, hepatosplenomegaly or hernias noted.  No guarding or rebound tenderness.    Msk:  Symmetrical without gross deformities. Good, equal movement & strength bilaterally. Pulses:  Normal pulses noted. Extremities:  No clubbing or edema.  No cyanosis. Neurologic:  Alert and oriented x3;  grossly normal neurologically. Skin:  Intact without significant lesions or rashes. No jaundice. Lymph Nodes:  No significant cervical adenopathy. Psych:  Alert and cooperative. Normal mood and affect.  Imaging Studies: No results found.  Assessment and Plan:   Michael Pope  21 y.o. male with elevated liver function tests most likely secondary to medication related injury. However, further labs are necessary to look for secondary causes of elevated LFTs   1. Complete secondary liver disease work up as ordered 2. Perform RUQ [place]  3. Avoid tylenol, NSAIDs, alcohol   Follow up in 3 months   Arlyss Repress, MD

## 2017-01-02 LAB — ANA: ANA: POSITIVE — AB

## 2017-01-02 LAB — PROTIME-INR
INR: 1.1 (ref 0.8–1.2)
PROTHROMBIN TIME: 10.9 s (ref 9.1–12.0)

## 2017-01-02 LAB — HIV ANTIBODY (ROUTINE TESTING W REFLEX): HIV Screen 4th Generation wRfx: NONREACTIVE

## 2017-01-02 LAB — PLEASE NOTE

## 2017-01-02 LAB — HEPATITIS A ANTIBODY, TOTAL: Hep A Total Ab: POSITIVE — AB

## 2017-01-02 LAB — ALPHA-1-ANTITRYPSIN: A1 ANTITRYPSIN: 143 mg/dL (ref 90–200)

## 2017-01-02 LAB — ANTI-MICROSOMAL ANTIBODY LIVER / KIDNEY: LKM1 Ab: 2.3 Units (ref 0.0–20.0)

## 2017-01-02 LAB — MITOCHONDRIAL/SMOOTH MUSCLE AB PNL
Mitochondrial Ab: 5.8 Units (ref 0.0–20.0)
Smooth Muscle Ab: 12 Units (ref 0–19)

## 2017-01-02 LAB — HEPATITIS B SURFACE ANTIBODY,QUALITATIVE: HEP B SURFACE AB, QUAL: NONREACTIVE

## 2017-01-02 LAB — CERULOPLASMIN: CERULOPLASMIN: 21.3 mg/dL (ref 16.0–31.0)

## 2017-01-02 LAB — HEPATITIS B SURFACE ANTIGEN: HEP B S AG: NEGATIVE

## 2017-01-03 ENCOUNTER — Encounter: Payer: Self-pay | Admitting: Gastroenterology

## 2017-01-04 ENCOUNTER — Other Ambulatory Visit: Payer: Self-pay

## 2017-01-06 ENCOUNTER — Ambulatory Visit: Payer: Medicaid Other

## 2017-02-01 ENCOUNTER — Emergency Department
Admission: EM | Admit: 2017-02-01 | Discharge: 2017-02-02 | Disposition: A | Discharge status: Home / Self Care | Payer: Medicaid Other | Attending: Emergency Medicine | Admitting: Emergency Medicine

## 2017-02-01 ENCOUNTER — Encounter: Payer: Self-pay | Admitting: Emergency Medicine

## 2017-02-01 DIAGNOSIS — Z79899 Other long term (current) drug therapy: Secondary | ICD-10-CM | POA: Diagnosis not present

## 2017-02-01 DIAGNOSIS — Z91010 Allergy to peanuts: Secondary | ICD-10-CM | POA: Diagnosis not present

## 2017-02-01 DIAGNOSIS — R569 Unspecified convulsions: Secondary | ICD-10-CM | POA: Insufficient documentation

## 2017-02-01 LAB — BASIC METABOLIC PANEL
ANION GAP: 10 (ref 5–15)
BUN: 7 mg/dL (ref 6–20)
CHLORIDE: 102 mmol/L (ref 101–111)
CO2: 27 mmol/L (ref 22–32)
Calcium: 9.8 mg/dL (ref 8.9–10.3)
Creatinine, Ser: 1.06 mg/dL (ref 0.61–1.24)
Glucose, Bld: 84 mg/dL (ref 65–99)
POTASSIUM: 3.9 mmol/L (ref 3.5–5.1)
SODIUM: 139 mmol/L (ref 135–145)

## 2017-02-01 LAB — CBC WITH DIFFERENTIAL/PLATELET
BASOS ABS: 0 10*3/uL (ref 0–0.1)
BASOS PCT: 1 %
EOS ABS: 0.1 10*3/uL (ref 0–0.7)
EOS PCT: 1 %
HCT: 37.6 % — ABNORMAL LOW (ref 40.0–52.0)
HEMOGLOBIN: 12.9 g/dL — AB (ref 13.0–18.0)
LYMPHS ABS: 2.5 10*3/uL (ref 1.0–3.6)
Lymphocytes Relative: 37 %
MCH: 30.5 pg (ref 26.0–34.0)
MCHC: 34.4 g/dL (ref 32.0–36.0)
MCV: 88.7 fL (ref 80.0–100.0)
Monocytes Absolute: 0.5 10*3/uL (ref 0.2–1.0)
Monocytes Relative: 8 %
NEUTROS PCT: 53 %
Neutro Abs: 3.5 10*3/uL (ref 1.4–6.5)
PLATELETS: 197 10*3/uL (ref 150–440)
RBC: 4.24 MIL/uL — AB (ref 4.40–5.90)
RDW: 13.2 % (ref 11.5–14.5)
WBC: 6.6 10*3/uL (ref 3.8–10.6)

## 2017-02-01 LAB — URINE DRUG SCREEN, QUALITATIVE (ARMC ONLY)
AMPHETAMINES, UR SCREEN: NOT DETECTED
Barbiturates, Ur Screen: NOT DETECTED
Benzodiazepine, Ur Scrn: NOT DETECTED
CANNABINOID 50 NG, UR ~~LOC~~: NOT DETECTED
COCAINE METABOLITE, UR ~~LOC~~: NOT DETECTED
MDMA (ECSTASY) UR SCREEN: NOT DETECTED
METHADONE SCREEN, URINE: NOT DETECTED
Opiate, Ur Screen: NOT DETECTED
Phencyclidine (PCP) Ur S: NOT DETECTED
TRICYCLIC, UR SCREEN: NOT DETECTED

## 2017-02-01 MED ORDER — LAMOTRIGINE 100 MG PO TABS
400.0000 mg | ORAL_TABLET | Freq: Once | ORAL | Status: AC
  Administered 2017-02-01: 400 mg via ORAL
  Filled 2017-02-01: qty 4

## 2017-02-01 NOTE — Discharge Instructions (Signed)
Please seek medical attention for any high fevers, chest pain, shortness of breath, change in behavior, persistent vomiting, bloody stool or any other new or concerning symptoms.  

## 2017-02-01 NOTE — ED Notes (Signed)
Called Judson Roch for pt ride home. [phone]. He stated he would be to ED in 10-15 minutes to pick pt up.

## 2017-02-01 NOTE — ED Notes (Signed)
This RN called Michael Pope to figure out legal guardianship of pt. He stated that he could give consent for treatment. [phone]. Gave consent over the phone.

## 2017-02-01 NOTE — ED Triage Notes (Addendum)
Patient ambulatory to triage with steady gait, without difficulty or distress noted; pt reports brought in by EMS for seizure; st hx of same and taking med as admin by group home but has not had pm dose; pt denies any c/o; per first nurse,EMS stated seizure last approx 10sec and was no postictal

## 2017-02-01 NOTE — ED Notes (Signed)
Pt standing in room, watching tv. No distress noted at this time. Denied any needs.

## 2017-02-01 NOTE — ED Notes (Addendum)
Pt brought in by ems for seizure, pt has hx of pseudoseizures. Pt is from a group home owner is Clinton Sawyer [phone], Carlis Abbott [phone] when ready to be picked up. EMS states no seizure behavior witness or postictal behavior.

## 2017-02-01 NOTE — ED Notes (Signed)
Pt standing in doorway, informed that urine results are not back at this time. Informed once that returns then the doctor will come talk to him. Pt went back into room.

## 2017-02-02 NOTE — ED Provider Notes (Signed)
[facility] Emergency Department Provider Note   ____________________________________________   I have reviewed the triage vital signs and the nursing notes.   HISTORY  Chief Complaint Seizure like activity   History limited by: Not Limited   HPI Michael Pope is a 21 y.o. male who presents to the emergency department today after potential seizure. Patient states he has a history of seizures disorder. Today he started feeling like his hand was shaking which she states usually happens before his seizure. He then states that he was told by other members of his group home that he had a 10 second seizure. He states that he tends to get these seizures when he gets frustrated and upset. He states he was getting upset today at some of the other residents at his group home. Time of examination he states he feels fine. He had not yet gotten his nighttime dose of seizure medication however this was given to him in triage.    Past Medical History:  Diagnosis Date  . Conduct disorder   . Disruptive mood dysregulation disorder (HCC)   . Mild intellectual disability   . PTSD (post-traumatic stress disorder)   . Seizures [facility])     Patient Active Problem List   Diagnosis Date Noted  . Elevated LFTs 12/31/2016  . Nonintractable generalized idiopathic epilepsy without status epilepticus (HCC) 10/15/2016  . Seizure (HCC) 09/08/2016  . 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 Allergy to peanuts 04/29/2013    History reviewed. No pertinent surgical history.  Prior to Admission medications   Medication Sig Start Date End Date Taking? Authorizing Provider  baclofen (LIORESAL) 10 MG tablet Take 1 tablet (10 mg total) by mouth 3 (three) times daily. 04/17/16   Beers, Charmayne Sheer, PA-C  EPINEPHrine 0.3 mg/0.3 mL IJ SOAJ injection Inject into the muscle.    [provider]  FLUoxetine (PROZAC) 20 MG capsule Take 1 capsule (20  mg total) by mouth daily. 05/12/16   Irean Hong, MD  GUAIFENESIN PO Take by mouth.    [provider]  guanFACINE (TENEX) 2 MG tablet Take 2 mg by mouth 2 (two) times daily.    [provider]  HYDROcodone-acetaminophen (NORCO/VICODIN) 5-325 MG tablet Take 1 tablet by mouth every 4 (four) hours as needed. 04/05/15   Hayden Rasmussen, NP  LAMOTRIGINE PO Take 400 mg by mouth 2 (two) times daily.     [provider]  ondansetron (ZOFRAN ODT) 4 MG disintegrating tablet Take 1 tablet (4 mg total) by mouth every 8 (eight) hours as needed for nausea or vomiting. 01/27/15   Nani Ravens, MD  polyethylene glycol-electrolytes (NULYTELY/GOLYTELY) 420 G solution Take 4,000 mLs by mouth once. 01/27/15   Ozella Rocks, MD  prazosin (MINIPRESS) 1 MG capsule Take by mouth.    [provider]  QUEtiapine (SEROQUEL) 100 MG tablet Take by mouth.    [provider]  traZODone (DESYREL) 100 MG tablet Take 100 mg by mouth at bedtime.    [provider]    Allergies Almond oil; Fish allergy; Other; Peanuts [peanut oil]; and Pollen extract  No family history on file.  Social History Social History  Substance Use Topics  . Smoking status: Current Every Day Smoker  . Smokeless tobacco: Current User  . Alcohol use No    Review of Systems Constitutional: No fever/chills Eyes: No visual changes. ENT: No sore throat. Cardiovascular: Denies chest pain. Respiratory: Denies shortness of  breath. Gastrointestinal: No abdominal pain.  No nausea, no vomiting.  No diarrhea.   Genitourinary: Negative for dysuria. Musculoskeletal: Negative for back pain. Skin: Negative for rash. Neurological: Positive for seizure-like activity  ____________________________________________   PHYSICAL EXAM:  VITAL SIGNS: ED Triage Vitals  Enc Vitals Group     BP 02/01/17 2035 132/62     Pulse Rate 02/01/17 2035 62     Resp 02/01/17 2035 20     Temp 02/01/17 2035 97.8 F  (36.6 C)     Temp Source 02/01/17 2035 Oral     SpO2 02/01/17 2035 100 %     Weight --      Height 02/01/17 2036  (1.778 m)     Head Circumference --      Peak Flow --      Pain Score 02/01/17 2341 0   Constitutional: Alert and oriented. Well appearing and in no distress. Eyes: Conjunctivae are normal.  ENT   Head: Normocephalic and atraumatic.   Nose: No congestion/rhinnorhea.   Mouth/Throat: Mucous membranes are moist.   Neck: No stridor. Hematological/Lymphatic/Immunilogical: No cervical lymphadenopathy. Cardiovascular: Normal rate, regular rhythm.  No murmurs, rubs, or gallops.  Respiratory: Normal respiratory effort without tachypnea nor retractions. Breath sounds are clear and equal bilaterally. No wheezes/rales/rhonchi. Gastrointestinal: Soft and non tender. No rebound. No guarding.  Genitourinary: Deferred Musculoskeletal: Normal range of motion in all extremities. No lower extremity edema. Neurologic:  Normal speech and language. No gross focal neurologic deficits are appreciated.  Skin:  Skin is warm, dry and intact. No rash noted. Psychiatric: Mood and affect are normal. Speech and behavior are normal. Patient exhibits appropriate insight and judgment.  ____________________________________________    LABS (pertinent positives/negatives)  BMP wnl UDS wnl CBC hgb 12.9  ____________________________________________   EKG  None  ____________________________________________    RADIOLOGY  None  ____________________________________________   PROCEDURES  Procedures  ____________________________________________   INITIAL IMPRESSION / ASSESSMENT AND PLAN / ED COURSE  Pertinent labs & imaging results that were available during my care of the patient were reviewed by me and considered in my medical decision making (see chart for details).  Patient here for possible seizure-like activity. It is unclear at this point patient had a true  seizure or potential pseudoseizure. Given that it was stress related I think the latter is certainly possible. However blood work was checked without any concerning findings. Patient did feel fine here in the emergency department and did feel comfortable going home.  ____________________________________________   FINAL CLINICAL IMPRESSION(S) / ED DIAGNOSES  Final diagnoses:  Seizure [facility])     Note: This dictation was prepared with Dragon dictation. Any transcriptional errors that result from this process are unintentional     Phineas Semen, MD 02/02/17 [phone]

## 2017-03-23 ENCOUNTER — Emergency Department
Admission: EM | Admit: 2017-03-23 | Discharge: 2017-03-23 | Disposition: A | Discharge status: Home / Self Care | Payer: Medicaid Other | Attending: Emergency Medicine | Admitting: Emergency Medicine

## 2017-03-23 ENCOUNTER — Encounter: Payer: Self-pay | Admitting: Emergency Medicine

## 2017-03-23 DIAGNOSIS — R569 Unspecified convulsions: Secondary | ICD-10-CM

## 2017-03-23 DIAGNOSIS — F4325 Adjustment disorder with mixed disturbance of emotions and conduct: Secondary | ICD-10-CM | POA: Diagnosis not present

## 2017-03-23 DIAGNOSIS — Z91010 Allergy to peanuts: Secondary | ICD-10-CM | POA: Diagnosis not present

## 2017-03-23 DIAGNOSIS — Z79899 Other long term (current) drug therapy: Secondary | ICD-10-CM | POA: Insufficient documentation

## 2017-03-23 DIAGNOSIS — F17210 Nicotine dependence, cigarettes, uncomplicated: Secondary | ICD-10-CM | POA: Insufficient documentation

## 2017-03-23 LAB — BASIC METABOLIC PANEL
Anion gap: 5 (ref 5–15)
BUN: 10 mg/dL (ref 6–20)
CHLORIDE: 106 mmol/L (ref 101–111)
CO2: 26 mmol/L (ref 22–32)
CREATININE: 0.86 mg/dL (ref 0.61–1.24)
Calcium: 9.8 mg/dL (ref 8.9–10.3)
Glucose, Bld: 89 mg/dL (ref 65–99)
POTASSIUM: 4 mmol/L (ref 3.5–5.1)
SODIUM: 137 mmol/L (ref 135–145)

## 2017-03-23 LAB — CBC
HCT: 37.7 % — ABNORMAL LOW (ref 40.0–52.0)
HEMOGLOBIN: 12.5 g/dL — AB (ref 13.0–18.0)
MCH: 30.3 pg (ref 26.0–34.0)
MCHC: 33.2 g/dL (ref 32.0–36.0)
MCV: 91.3 fL (ref 80.0–100.0)
PLATELETS: 179 10*3/uL (ref 150–440)
RBC: 4.13 MIL/uL — AB (ref 4.40–5.90)
RDW: 12.8 % (ref 11.5–14.5)
WBC: 5.1 10*3/uL (ref 3.8–10.6)

## 2017-03-23 NOTE — ED Provider Notes (Signed)
[facility] Emergency Department Provider Note  Time seen: 8:18 AM  I have reviewed the triage vital signs and the nursing notes.   HISTORY  Chief Complaint Seizures    HPI Michael Pope is a 21 y.o. male with a past medical history of PTSD, seizure disorder, presents to the emergency department after a possible seizure.  According to the patient he was upset this morning because of something that happened at his group home.  States he was told that he had a seizure lasting several minutes.  Patient's only complaint currently is of fatigue, states he is feeling tired but has no other complaints.  Denies any pain.  Denies any recent illnesses.  Patient states he takes Lamictal for his seizure disorder.  Past Medical History:  Diagnosis Date  . Conduct disorder   . Disruptive mood dysregulation disorder (HCC)   . Mild intellectual disability   . PTSD (post-traumatic stress disorder)   . Seizures [facility](HCC)     Patient Active Problem List   Diagnosis Date Noted  . Elevated LFTs 12/31/2016  . Nonintractable generalized idiopathic epilepsy without status epilepticus (HCC) 10/15/2016  . Seizure (HCC) 09/08/2016  . 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 Allergy to peanuts 04/29/2013    History reviewed. No pertinent surgical history.  Prior to Admission medications   Medication Sig Start Date End Date Taking? Authorizing Provider  baclofen (LIORESAL) 10 MG tablet Take 1 tablet (10 mg total) by mouth 3 (three) times daily. 04/17/16   Beers, Charmayne Sheerharles M, PA-C  EPINEPHrine 0.3 mg/0.3 mL IJ SOAJ injection Inject into the muscle.    [provider]  FLUoxetine (PROZAC) 20 MG capsule Take 1 capsule (20 mg total) by mouth daily. 05/12/16   Irean HongSung, Jade J, MD  GUAIFENESIN PO Take by mouth.    [provider]  guanFACINE (TENEX) 2 MG tablet Take 2 mg by mouth 2 (two) times daily.    [provider]   HYDROcodone-acetaminophen (NORCO/VICODIN) 5-325 MG tablet Take 1 tablet by mouth every 4 (four) hours as needed. 04/05/15   Hayden RasmussenMabe, David, NP  LAMOTRIGINE PO Take 400 mg by mouth 2 (two) times daily.     [provider]  ondansetron (ZOFRAN ODT) 4 MG disintegrating tablet Take 1 tablet (4 mg total) by mouth every 8 (eight) hours as needed for nausea or vomiting. 01/27/15   Nani RavensWight, Andrew M, MD  polyethylene glycol-electrolytes (NULYTELY/GOLYTELY) 420 G solution Take 4,000 mLs by mouth once. 01/27/15   Ozella RocksMerrell, David J, MD  prazosin (MINIPRESS) 1 MG capsule Take by mouth.    [provider]  QUEtiapine (SEROQUEL) 100 MG tablet Take by mouth.    [provider]  traZODone (DESYREL) 100 MG tablet Take 100 mg by mouth at bedtime.    [provider]    Allergies  Allergen Reactions  . Almond Oil   . Fish Allergy   . Other     Other reaction(s): Unknown Almond, fish, peanuts  . Peanuts [Peanut Oil]   . Pollen Extract     No family history on file.  Social History Social History   Tobacco Use  . Smoking status: Current Every Day Smoker  . Smokeless tobacco: Current User  Substance Use Topics  . Alcohol use: No  . Drug use: No    Review of Systems Constitutional: Negative for fever. Cardiovascular: Negative for chest pain. Respiratory: Negative for shortness of breath. Gastrointestinal: Negative for abdominal pain  Musculoskeletal: Negative for back pain. Neurological: Negative for headache All other ROS negative  ____________________________________________   PHYSICAL EXAM:  VITAL SIGNS: ED Triage Vitals [03/23/17 0734]  Enc Vitals Group     BP (!) 114/56     Pulse Rate 81     Resp 16     Temp 98.8 F (37.1 C)     Temp Source Oral     SpO2 99 %     Weight 150 lb (68 kg)     Height 5\' 10"  (1.778 m)     Head Circumference      Peak Flow      Pain Score      Pain Loc      Pain Edu?      Excl. in GC?    Constitutional: Alert and  oriented. Well appearing and in no distress. Eyes: Normal exam ENT   Head: Normocephalic and atraumatic   Mouth/Throat: Mucous membranes are moist.  No tongue laceration/abrasion Cardiovascular: Normal rate, regular rhythm. No murmur Respiratory: Normal respiratory effort without tachypnea nor retractions. Breath sounds are clear  Gastrointestinal: Soft and nontender. No distention.  Musculoskeletal: Nontender with normal range of motion in all extremities.  Neurologic:  Normal speech and language. No gross focal neurologic deficits Skin:  Skin is warm, dry and intact.  Psychiatric: Mood and affect are normal.   ____________________________________________  INITIAL IMPRESSION / ASSESSMENT AND PLAN / ED COURSE  Pertinent labs & imaging results that were available during my care of the patient were reviewed by me and considered in my medical decision making (see chart for details).  Patient presents to the emergency department for likely seizure.  Differential would include seizure, seizure-like activity, agitation.  I reviewed the patient's records including his neurology records by Dr. Malvin JohnsPotter which confirmed generalized seizure activity, placed on Lamictal.  We will check basic labs and continue to closely monitor.  Patient is requesting something to eat and drink we will feed in the emergency department.  As long as the patient continues to feel better and labs are normal patient will be discharged home with neurology follow-up and continuing his normal daily medications.  Patient is agreeable to this plan of care.  Patient's labs are largely within normal limits.  Patient continues to appear very well in the emergency department.  Will discharge home.  Patient agreeable with this plan of care ____________________________________________   FINAL CLINICAL IMPRESSION(S) / ED DIAGNOSES  Seizure    Minna AntisPaduchowski, Monique Gift, MD 03/23/17 [phone]

## 2017-03-23 NOTE — ED Notes (Signed)
Attempted to call care giver, Michael MunroeBryon Pope, for an updated ETA. No answer.

## 2017-03-23 NOTE — ED Notes (Signed)
Attempted to call patients legal guardian. No answer at this time.  [phone].

## 2017-03-23 NOTE — ED Notes (Signed)
Patients group home called and notified that patient is ready to be picked up. Michael Pope states he is on his way.

## 2017-03-23 NOTE — ED Triage Notes (Signed)
Patient presents to ED via [facility] from his group home. When asked why patient is from a group home he states "some shit is going on with my mom and her boyfriend". Patient fell into a flower bed and began seizing. Another resident witnessed the seizure. States it lasted no more than 2 minutes. Patient denies loss of control of bowels or bladder. Patient denies any pain at this time. Hx of seizures and on medication but unable to state what the medication is called.

## 2017-07-30 ENCOUNTER — Other Ambulatory Visit: Payer: Self-pay

## 2017-07-30 ENCOUNTER — Observation Stay
Admission: EM | Admit: 2017-07-30 | Discharge: 2017-08-01 | Disposition: A | Discharge status: Home / Self Care | Payer: Medicaid Other | Attending: Internal Medicine | Admitting: Internal Medicine

## 2017-07-30 ENCOUNTER — Emergency Department
Admission: EM | Admit: 2017-07-30 | Discharge: 2017-07-30 | Disposition: A | Discharge status: Home / Self Care | Payer: Medicaid Other | Source: Home / Self Care | Attending: Emergency Medicine | Admitting: Emergency Medicine

## 2017-07-30 DIAGNOSIS — F79 Unspecified intellectual disabilities: Secondary | ICD-10-CM | POA: Insufficient documentation

## 2017-07-30 DIAGNOSIS — G40909 Epilepsy, unspecified, not intractable, without status epilepticus: Principal | ICD-10-CM | POA: Insufficient documentation

## 2017-07-30 DIAGNOSIS — R569 Unspecified convulsions: Secondary | ICD-10-CM

## 2017-07-30 DIAGNOSIS — F17200 Nicotine dependence, unspecified, uncomplicated: Secondary | ICD-10-CM | POA: Insufficient documentation

## 2017-07-30 DIAGNOSIS — Z8249 Family history of ischemic heart disease and other diseases of the circulatory system: Secondary | ICD-10-CM | POA: Insufficient documentation

## 2017-07-30 DIAGNOSIS — F4310 Post-traumatic stress disorder, unspecified: Secondary | ICD-10-CM | POA: Diagnosis not present

## 2017-07-30 DIAGNOSIS — Z79899 Other long term (current) drug therapy: Secondary | ICD-10-CM | POA: Insufficient documentation

## 2017-07-30 LAB — BASIC METABOLIC PANEL
ANION GAP: 10 (ref 5–15)
BUN: 11 mg/dL (ref 6–20)
CO2: 24 mmol/L (ref 22–32)
Calcium: 9.7 mg/dL (ref 8.9–10.3)
Chloride: 106 mmol/L (ref 101–111)
Creatinine, Ser: 0.94 mg/dL (ref 0.61–1.24)
GFR calc Af Amer: >60 mL/min (ref >60)
GFR calc non Af Amer: >60 mL/min (ref >60)
GLUCOSE: 82 mg/dL (ref 65–99)
POTASSIUM: 3.9 mmol/L (ref 3.5–5.1)
Sodium: 140 mmol/L (ref 135–145)

## 2017-07-30 LAB — URINE DRUG SCREEN, QUALITATIVE (ARMC ONLY)
AMPHETAMINES, UR SCREEN: NOT DETECTED
BENZODIAZEPINE, UR SCRN: NOT DETECTED
Barbiturates, Ur Screen: NOT DETECTED
Cannabinoid 50 Ng, Ur ~~LOC~~: NOT DETECTED
Cocaine Metabolite,Ur ~~LOC~~: NOT DETECTED
MDMA (ECSTASY) UR SCREEN: NOT DETECTED
METHADONE SCREEN, URINE: NOT DETECTED
OPIATE, UR SCREEN: NOT DETECTED
PHENCYCLIDINE (PCP) UR S: NOT DETECTED
Tricyclic, Ur Screen: POSITIVE — AB

## 2017-07-30 LAB — CBC WITH DIFFERENTIAL/PLATELET
BASOS ABS: 0 10*3/uL (ref 0–0.1)
Basophils Relative: 0 %
EOS PCT: 1 %
Eosinophils Absolute: 0 10*3/uL (ref 0–0.7)
HEMATOCRIT: 43.6 % (ref 40.0–52.0)
Hemoglobin: 14.2 g/dL (ref 13.0–18.0)
LYMPHS ABS: 1 10*3/uL (ref 1.0–3.6)
LYMPHS PCT: 19 %
MCH: 30.2 pg (ref 26.0–34.0)
MCHC: 32.5 g/dL (ref 32.0–36.0)
MCV: 92.9 fL (ref 80.0–100.0)
MONO ABS: 0.5 10*3/uL (ref 0.2–1.0)
Monocytes Relative: 9 %
NEUTROS ABS: 3.7 10*3/uL (ref 1.4–6.5)
Neutrophils Relative %: 71 %
Platelets: 184 10*3/uL (ref 150–440)
RBC: 4.69 MIL/uL (ref 4.40–5.90)
RDW: 13.3 % (ref 11.5–14.5)
WBC: 5.2 10*3/uL (ref 3.8–10.6)

## 2017-07-30 MED ORDER — HEPARIN SODIUM (PORCINE) 5000 UNIT/ML IJ SOLN
5000.0000 [IU] | Freq: Three times a day (TID) | INTRAMUSCULAR | Status: DC
  Filled 2017-07-30: qty 1

## 2017-07-30 MED ORDER — DOCUSATE SODIUM 100 MG PO CAPS: 100.0000 mg | ORAL_CAPSULE | Freq: Two times a day (BID) | ORAL | Status: DC | PRN

## 2017-07-30 MED ORDER — PRAZOSIN HCL 1 MG PO CAPS
1.0000 mg | ORAL_CAPSULE | Freq: Every day | ORAL | Status: DC
  Administered 2017-07-31 (×2): 1 mg via ORAL
  Filled 2017-07-30 (×3): qty 1

## 2017-07-30 MED ORDER — QUETIAPINE FUMARATE 25 MG PO TABS
100.0000 mg | ORAL_TABLET | Freq: Every day | ORAL | Status: DC
  Administered 2017-07-31 (×2): 100 mg via ORAL
  Filled 2017-07-30 (×2): qty 4

## 2017-07-30 MED ORDER — BACLOFEN 10 MG PO TABS
10.0000 mg | ORAL_TABLET | ORAL | Status: AC
  Administered 2017-07-30: 10 mg via ORAL
  Filled 2017-07-30: qty 1

## 2017-07-30 MED ORDER — BACLOFEN 10 MG PO TABS
10.0000 mg | ORAL_TABLET | Freq: Three times a day (TID) | ORAL | Status: DC
  Administered 2017-07-31 – 2017-08-01 (×5): 10 mg via ORAL
  Filled 2017-07-30 (×7): qty 1

## 2017-07-30 MED ORDER — LORAZEPAM 2 MG/ML IJ SOLN: 2.0000 mg | INTRAMUSCULAR | Status: DC | PRN

## 2017-07-30 MED ORDER — LAMOTRIGINE 25 MG PO TABS: 25.0000 mg | ORAL_TABLET | Freq: Two times a day (BID) | ORAL | 0 refills | Status: DC

## 2017-07-30 MED ORDER — FLUOXETINE HCL 20 MG PO CAPS
20.0000 mg | ORAL_CAPSULE | Freq: Every day | ORAL | Status: DC
  Administered 2017-07-31 – 2017-08-01 (×2): 20 mg via ORAL
  Filled 2017-07-30 (×2): qty 1

## 2017-07-30 MED ORDER — LAMOTRIGINE 25 MG PO TABS
150.0000 mg | ORAL_TABLET | Freq: Two times a day (BID) | ORAL | Status: DC
  Administered 2017-07-31 (×3): 150 mg via ORAL
  Filled 2017-07-30 (×3): qty 1

## 2017-07-30 NOTE — ED Notes (Signed)
Michael Pope from [facility] stopped by to check on patient. Michael Pope left number for Michael Pope, who works at the group home pt stays at. Michael JacksShannel has information for pts legal guardian.   Attempted to call Michael Pope at [phone], no answer, mailbox full, unable to leave message. Will try to call again.

## 2017-07-30 NOTE — ED Notes (Signed)
Called Melissa and left message again. Called [place] and unable to leave message due to box being full.

## 2017-07-30 NOTE — H&P (Signed)
[facility] - [place] [facility]   PATIENT NAME: Michael Pope    MR#:  [id number]  DATE OF BIRTH:  [birth year]  DATE OF ADMISSION:  07/30/2017  PRIMARY CARE PHYSICIAN: Patient, No Pcp Per   REQUESTING/REFERRING PHYSICIAN: stafford  CHIEF COMPLAINT:   Chief Complaint  Patient presents with  . Seizures    HISTORY OF PRESENT ILLNESS: Michael RuaDavieon Karpel  is a 22 y.o. male with a known history of seizure disorder, posttraumatic stress disorder- is on seizure medications since long time and his last seizures was a few years ago. Today morning came to ER with episode of seizure. ER physician had discharged him with advice to increase the dose of seizure medications. Patient was sent home with group home staff , while on the way he had 2 brief seizures episodes again so brought back to the emergency room.  Patient denies missing any doses of antiseizure medications, denies sleep deprivation or excessive use of energy drink or coffee in last few days.He denies any use of drugs in last few days. As he came back again to ER with seizure, suggested now to admit him under medical service.  PAST MEDICAL HISTORY:   Past Medical History:  Diagnosis Date  . Conduct disorder   . Disruptive mood dysregulation disorder (HCC)   . Mild intellectual disability   . PTSD (post-traumatic stress disorder)   . Seizures (HCC)     PAST SURGICAL HISTORY: History reviewed. No pertinent surgical history.  SOCIAL HISTORY:  Social History   Tobacco Use  . Smoking status: Current Every Day Smoker  . Smokeless tobacco: Current User  Substance Use Topics  . Alcohol use: No    FAMILY HISTORY:  Family History  Problem Relation Age of Onset  . CAD Father     DRUG ALLERGIES:  Allergies  Allergen Reactions  . Almond Oil   . Fish Allergy   . Other     Other reaction(s): Unknown Almond, fish, peanuts  . Peanuts [Peanut Oil]   . Pollen Extract     REVIEW OF SYSTEMS:    CONSTITUTIONAL: No fever, fatigue or weakness.  EYES: No blurred or double vision.  EARS, NOSE, AND THROAT: No tinnitus or ear pain.  RESPIRATORY: No cough, shortness of breath, wheezing or hemoptysis.  CARDIOVASCULAR: No chest pain, orthopnea, edema.  GASTROINTESTINAL: No nausea, vomiting, diarrhea or abdominal pain.  GENITOURINARY: No dysuria, hematuria.  ENDOCRINE: No polyuria, nocturia,  HEMATOLOGY: No anemia, easy bruising or bleeding SKIN: No rash or lesion. MUSCULOSKELETAL: No joint pain or arthritis.   NEUROLOGIC: No tingling, numbness, weakness.  PSYCHIATRY: No anxiety or depression.   MEDICATIONS AT HOME:  Prior to Admission medications   Medication Sig Start Date End Date Taking? Authorizing Provider  baclofen (LIORESAL) 10 MG tablet Take 1 tablet (10 mg total) by mouth 3 (three) times daily. 04/17/16  Yes Beers, Charmayne Sheerharles M, PA-C  EPINEPHrine 0.3 mg/0.3 mL IJ SOAJ injection Inject into the muscle.   Yes [provider]  FLUoxetine (PROZAC) 20 MG capsule Take 1 capsule (20 mg total) by mouth daily. 05/12/16  Yes Irean HongSung, Jade J, MD  lamoTRIgine (LAMICTAL) 25 MG tablet Take 1 tablet (25 mg total) by mouth 2 (two) times daily. Patient taking differently: Take 150 mg by mouth 2 (two) times daily.  07/30/17 07/30/18 Yes Governor RooksLord, Rebecca, MD  prazosin (MINIPRESS) 1 MG capsule Take by mouth.   Yes [provider]  QUEtiapine (SEROQUEL) 100 MG tablet Take by mouth.  Yes [provider]      PHYSICAL EXAMINATION:   VITAL SIGNS: Blood pressure 130/69, pulse 84, resp. rate 19, height 5\' 10"  (1.778 m), weight 68 kg (150 lb), SpO2 97 %.  GENERAL:  22 y.o.-year-old patient lying in the bed with no acute distress.  EYES: Pupils equal, round, reactive to light and accommodation. No scleral icterus. Extraocular muscles intact.  HEENT: Head atraumatic, normocephalic. Oropharynx and nasopharynx clear.  NECK:  Supple, no jugular venous distention. No thyroid  enlargement, no tenderness.  LUNGS: Normal breath sounds bilaterally, no wheezing, rales,rhonchi or crepitation. No use of accessory muscles of respiration.  CARDIOVASCULAR: S1, S2 normal. No murmurs, rubs, or gallops.  ABDOMEN: Soft, nontender, nondistended. Bowel sounds present. No organomegaly or mass.  EXTREMITIES: No pedal edema, cyanosis, or clubbing.  NEUROLOGIC: Cranial nerves II through XII are intact. Muscle strength 5/5 in all extremities. Sensation intact. Gait not checked.  PSYCHIATRIC: The patient is alert and oriented x 3.  SKIN: No obvious rash, lesion, or ulcer.   LABORATORY PANEL:   CBC Recent Labs  Lab 07/30/17 1135  WBC 5.2  HGB 14.2  HCT 43.6  PLT 184  MCV 92.9  MCH 30.2  MCHC 32.5  RDW 13.3  LYMPHSABS 1.0  MONOABS 0.5  EOSABS 0.0  BASOSABS 0.0   ------------------------------------------------------------------------------------------------------------------  Chemistries  Recent Labs  Lab 07/30/17 1135  NA 140  K 3.9  CL 106  CO2 24  GLUCOSE 82  BUN 11  CREATININE 0.94  CALCIUM 9.7   ------------------------------------------------------------------------------------------------------------------ estimated creatinine clearance is 120.6 mL/min (by C-G formula based on SCr of 0.94 mg/dL). ------------------------------------------------------------------------------------------------------------------ No results for input(s): TSH, T4TOTAL, T3FREE, THYROIDAB in the last 72 hours.  Invalid input(s): FREET3   Coagulation profile No results for input(s): INR, PROTIME in the last 168 hours. ------------------------------------------------------------------------------------------------------------------- No results for input(s): DDIMER in the last 72 hours. -------------------------------------------------------------------------------------------------------------------  Cardiac Enzymes No results for input(s): CKMB, TROPONINI, MYOGLOBIN in  the last 168 hours.  Invalid input(s): CK ------------------------------------------------------------------------------------------------------------------ Invalid input(s): POCBNP  ---------------------------------------------------------------------------------------------------------------  Urinalysis No results found for: COLORURINE, APPEARANCEUR, LABSPEC, PHURINE, GLUCOSEU, HGBUR, BILIRUBINUR, KETONESUR, PROTEINUR, UROBILINOGEN, NITRITE, LEUKOCYTESUR   RADIOLOGY: No results found.  EKG: Orders placed or performed during the hospital encounter of 07/30/17  . EKG 12-Lead  . EKG 12-Lead    IMPRESSION AND PLAN:  * seizure episode   Continue Lamictal as he takes home.   Recent CT head was negative.   Give lorazepam IV as needed for any episodes of seizures.   Neurology consult to readjust her medications.  * Posttraumatic stress disorder   Continue Prozac, Seroquel.  All the records are reviewed and case discussed with ED provider. Management plans discussed with the patient, family and they are in agreement.  CODE STATUS: full code Code Status History    This patient does not have a recorded code status. Please follow your organizational policy for patients in this situation.       TOTAL TIME TAKING CARE OF THIS PATIENT: 45 minutes.    Altamese Dilling M.D on 07/30/2017   Between 7am to 6pm - Pager - [phone]  After 6pm go to [email or web address] - password [email or web address]  [facility]  Office  [phone]  CC: Primary care physician; Patient, No Pcp Per   Note: This dictation was prepared with Dragon dictation along with smaller phrase technology. Any transcriptional errors that result from this process are unintentional.

## 2017-07-30 NOTE — ED Notes (Signed)
[name] arrived to pick up patient at this time. Discharge instructions reviewed with patient and [name]. Verbal understanding voiced and no questions at this time

## 2017-07-30 NOTE — ED Notes (Signed)
Called Melissa and left message notifying that pt was ready for discharge. Also called [name] with no answer.

## 2017-07-30 NOTE — ED Notes (Signed)
[name] called and notified IT consultantTracey secretary that she was on her way to get patient. [employer] officer still going to go to group home to verify.

## 2017-07-30 NOTE — ED Notes (Signed)
Pt given meal tray.

## 2017-07-30 NOTE — ED Notes (Addendum)
[name] notified that pt was back in the ED - she states she is on the way to the ED [phone]  Chavis called at [phone] - left HIPPA compliant message to return call to the ED  Melissa called at [phone] and was notified that pt was back in the ED  Pt comes from [facility]

## 2017-07-30 NOTE — ED Triage Notes (Addendum)
Pt comes via [facility] with c/o seizures. Pt was just discharged from ED less than a hour ago following being evaluted for seizure earlier today. Per EMS pt had 2 seizures lasting less than 1 minute on the ride home. Pt is alert and oriented and denies any pain. VS157/63,103, 98% and BS-113.

## 2017-07-30 NOTE — ED Provider Notes (Signed)
[facility] Emergency Department Provider Note ____________________________________________   I have reviewed the triage vital signs and the triage nursing note.  HISTORY  Chief Complaint Seizures   Historian Level 5 Caveat History Limited by patient and poor story History by staff report  HPI Michael Pope is a 22 y.o. male presents by EMS from group home where he is at due to conduct disorder, mild intellectual disability, and also has a history of seizures.  Looking at his medication list it appears that he is on Lamictal 200 mg twice daily seizures.  Patient is poor historian cannot tell me when his last seizure was.  Poorly patient had a witnessed seizure, with fall to the floor.  He however states that he feels totally fine now.  He states that he does not feel injured in any way.  No headache or neck pain or chest pain or back pain or extremity pain.  Does not report being recently ill.   No tongue biting or urination on himself.  I was able to review notes from the computerized medical record, most recent note from 04/12/2017 from Dr. Theora Master, neurology indicating his medication dosing be 200 mg twice daily.     Past Medical History:  Diagnosis Date  . Conduct disorder   . Disruptive mood dysregulation disorder (HCC)   . Mild intellectual disability   . PTSD (post-traumatic stress disorder)   . Seizures [facility])     Patient Active Problem List   Diagnosis Date Noted  . Elevated LFTs 12/31/2016  . Nonintractable generalized idiopathic epilepsy without status epilepticus (HCC) 10/15/2016  . Seizure (HCC) 09/08/2016  . 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 Allergy to peanuts 04/29/2013    History reviewed. No pertinent surgical history.  Prior to Admission medications   Medication Sig Start Date End Date Taking? Authorizing Provider  baclofen (LIORESAL) 10 MG tablet Take 1 tablet  (10 mg total) by mouth 3 (three) times daily. 04/17/16   Beers, Charmayne Sheer, PA-C  EPINEPHrine 0.3 mg/0.3 mL IJ SOAJ injection Inject into the muscle.    [provider]  FLUoxetine (PROZAC) 20 MG capsule Take 1 capsule (20 mg total) by mouth daily. 05/12/16   Irean Hong, MD  GUAIFENESIN PO Take by mouth.    [provider]  guanFACINE (TENEX) 2 MG tablet Take 2 mg by mouth 2 (two) times daily.    [provider]  HYDROcodone-acetaminophen (NORCO/VICODIN) 5-325 MG tablet Take 1 tablet by mouth every 4 (four) hours as needed. 04/05/15   Hayden Rasmussen, NP  lamoTRIgine (LAMICTAL) 25 MG tablet Take 1 tablet (25 mg total) by mouth 2 (two) times daily. 07/30/17 07/30/18  Governor Rooks, MD  LAMOTRIGINE PO Take 400 mg by mouth 2 (two) times daily.     [provider]  ondansetron (ZOFRAN ODT) 4 MG disintegrating tablet Take 1 tablet (4 mg total) by mouth every 8 (eight) hours as needed for nausea or vomiting. 01/27/15   Nani Ravens, MD  polyethylene glycol-electrolytes (NULYTELY/GOLYTELY) 420 G solution Take 4,000 mLs by mouth once. 01/27/15   Ozella Rocks, MD  prazosin (MINIPRESS) 1 MG capsule Take by mouth.    [provider]  QUEtiapine (SEROQUEL) 100 MG tablet Take by mouth.    [provider]  traZODone (DESYREL) 100 MG tablet Take 100 mg by mouth at bedtime.    [provider]    Allergies  Allergen Reactions  .  Almond Oil   . Fish Allergy   . Other     Other reaction(s): Unknown Almond, fish, peanuts  . Peanuts [Peanut Oil]   . Pollen Extract     No family history on file.  Social History Social History   Tobacco Use  . Smoking status: Current Every Day Smoker  . Smokeless tobacco: Current User  Substance Use Topics  . Alcohol use: No  . Drug use: No    Review of Systems  Constitutional: Negative for fever. Eyes: Negative for visual changes. ENT: Negative for sore throat. Cardiovascular: Negative for chest  pain. Respiratory: Negative for shortness of breath. Gastrointestinal: Negative for abdominal pain, vomiting and diarrhea. Genitourinary: Negative for dysuria. Musculoskeletal: Negative for back pain. Skin: Negative for rash. Neurological: Negative for headache.  ____________________________________________   PHYSICAL EXAM:  VITAL SIGNS: ED Triage Vitals  Enc Vitals Group     BP 07/30/17 1111 (!) 123/56     Pulse Rate 07/30/17 1111 88     Resp 07/30/17 1111 19     Temp 07/30/17 1111 97.7 F (36.5 C)     Temp Source 07/30/17 1111 Oral     SpO2 07/30/17 1111 98 %     Weight 07/30/17 1118 150 lb (68 kg)     Height 07/30/17 1113 5\' 10"  (1.778 m)     Head Circumference --      Peak Flow --      Pain Score --      Pain Loc --      Pain Edu? --      Excl. in GC? --      Constitutional: Alert and cooperative, bit of a poor historian overall. Well appearing and in no distress. HEENT   Head: Normocephalic and atraumatic.      Eyes: Conjunctivae are normal. Pupils equal and round.       Ears:         Nose: No congestion/rhinnorhea.   Mouth/Throat: Mucous membranes are moist.   Neck: No stridor.  No posterior neck pain to palpation range of motion. Cardiovascular/Chest: Normal rate, regular rhythm.  No murmurs, rubs, or gallops. Respiratory: Normal respiratory effort without tachypnea nor retractions. Breath sounds are clear and equal bilaterally. No wheezes/rales/rhonchi. Gastrointestinal: Soft. No distention, no guarding, no rebound. Nontender.    Genitourinary/rectal:Deferred Musculoskeletal: Nontender with normal range of motion in all extremities. No joint effusions.  No lower extremity tenderness.  No edema. Neurologic: No facial droop.  Normal speech and language. No gross or focal neurologic deficits are appreciated. Skin:  Skin is warm, dry and intact. No rash noted. Psychiatric: No agitation.   ____________________________________________  LABS (pertinent  positives/negatives) I, Governor Rooksebecca Lua Feng, MD the attending physician have reviewed the labs noted below.  Labs Reviewed  URINE DRUG SCREEN, QUALITATIVE ([facility] ONLY) - Abnormal; Notable for the following components:      Result Value   Tricyclic, Ur Screen POSITIVE (*)    All other components within normal limits  CBC WITH DIFFERENTIAL/PLATELET  BASIC METABOLIC PANEL  CBG MONITORING, ED     EKG: Reviewed and interpreted by myself, Dr. Shaune PollackLord MD 81 bpm normal sinus rhythm.  Narrow QrS.  Normal axis.  Normal ST and T wave __________________________________________  PROCEDURES  Procedure(s) performed: None  Procedures  Critical Care performed: None   ____________________________________________  ED COURSE / ASSESSMENT AND PLAN  Pertinent labs & imaging results that were available during my care of the patient were reviewed by me and considered in my  medical decision making (see chart for details).    Sounds like per report that he did have witnessed seizure activity although here he did not bite his tongue or urinate.  Patient himself is a poor historian.  He cannot really tell me when his last seizure was.  By the notes it looks like was probably in November of last year.  Dr. Lorenso Courier most recent note from that time indicated patient's dosing should be 200 mg lamotrigine twice daily which she is apparently on.  No additional risk factors reported for allowing his seizure threshold right now.  Is not appear to be ill.  He is back to baseline.    DIFFERENTIAL DIAGNOSIS: Including but not limited to seizure consistent with his known seizure disorder, seizure-like activity in association with psychiatric disturbance, electrolyte disorder, etc.  CONSULTATIONS:   Dr. Thad Ranger, neurology - discussed whether to change medication dosing -she is recommending an increase from 200 mg twice daily to 225 mg twice daily and follow-up with his outpatient neurologist.   Patient / Family /  Caregiver informed of clinical course, medical decision-making process, and agree with plan.   I discussed return precautions, follow-up instructions, and discharge instructions with patient and/or family.  Discharge Instructions : Your evaluated after reported seizure, and are back to normal now.  After discussion with the on-call neurologist, Dr. Thad Ranger, she recommends increasing her dose from 200 mg twice a day to 225 mg twice per day.  You are being prescribed an additional prescription for 25 mg tablets that he can take an additional 1 in addition to your current Lamictal dosing.  You then need to follow-up with your primary care as well as your neurologist.  Return to the emergency room immediately for any worsening condition including confusion or altered mental status, fever, agitation, weakness, numbness, or any other symptoms concerning to you.         ___________________________________________   FINAL CLINICAL IMPRESSION(S) / ED DIAGNOSES   Final diagnoses:  Seizure (HCC)      ___________________________________________        Note: This dictation was prepared with Dragon dictation. Any transcriptional errors that result from this process are unintentional    Governor Rooks, MD 07/30/17 1454

## 2017-07-30 NOTE — ED Notes (Signed)
Pharmacy emailed to send baclofen  

## 2017-07-30 NOTE — ED Notes (Signed)
Pt came to door of room and had pulled his IV out - cath was intact and bleeding was controlled

## 2017-07-30 NOTE — ED Notes (Signed)
Pt given supper tray with coke

## 2017-07-30 NOTE — ED Provider Notes (Signed)
[facility] Emergency Department Provider Note  ____________________________________________  Time seen: Approximately 7:44 PM  I have reviewed the triage vital signs and the nursing notes.   HISTORY  Chief Complaint Seizures  Level 5 Caveat: Portions of the History and Physical are unable to be obtained due to patient being a poor historian due to intellectual disability   HPI Michael Pope is a 22 y.o. male sent to the ED for evaluation of seizure. The patient was in the ED for a prolonged period of time earlier today after a single seizure at home. Neurology was contacted who recommended a dose adjustment of his antiepileptic medication. Eventually, was the guardian was contacted and we are able to facilitate a safe discharge, the patient was sent home with group home staff. However, reportedly en route back to the group home he had 2 brief seizures that resolved spontaneously.  The patient denies seizure. He states that he was resting with his eyes closed and feels totally normal. He denies urinary incontinence or tongue biting or headache or any other acute symptoms.     Past Medical History:  Diagnosis Date  . Conduct disorder   . Disruptive mood dysregulation disorder (HCC)   . Mild intellectual disability   . PTSD (post-traumatic stress disorder)   . Seizures [facility])      Patient Active Problem List   Diagnosis Date Noted  . Elevated LFTs 12/31/2016  . Nonintractable generalized idiopathic epilepsy without status epilepticus (HCC) 10/15/2016  . Seizure (HCC) 09/08/2016  . 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 Allergy to peanuts 04/29/2013     History reviewed. No pertinent surgical history.   Prior to Admission medications   Medication Sig Start Date End Date Taking? Authorizing Provider  baclofen (LIORESAL) 10 MG tablet Take 1 tablet (10 mg total) by mouth 3 (three) times daily. 04/17/16    Beers, Charmayne Sheer, PA-C  EPINEPHrine 0.3 mg/0.3 mL IJ SOAJ injection Inject into the muscle.    [provider]  FLUoxetine (PROZAC) 20 MG capsule Take 1 capsule (20 mg total) by mouth daily. 05/12/16   Irean Hong, MD  GUAIFENESIN PO Take by mouth.    [provider]  guanFACINE (TENEX) 2 MG tablet Take 2 mg by mouth 2 (two) times daily.    [provider]  HYDROcodone-acetaminophen (NORCO/VICODIN) 5-325 MG tablet Take 1 tablet by mouth every 4 (four) hours as needed. 04/05/15   Hayden Rasmussen, NP  lamoTRIgine (LAMICTAL) 25 MG tablet Take 1 tablet (25 mg total) by mouth 2 (two) times daily. 07/30/17 07/30/18  Governor Rooks, MD  LAMOTRIGINE PO Take 400 mg by mouth 2 (two) times daily.     [provider]  ondansetron (ZOFRAN ODT) 4 MG disintegrating tablet Take 1 tablet (4 mg total) by mouth every 8 (eight) hours as needed for nausea or vomiting. 01/27/15   Nani Ravens, MD  polyethylene glycol-electrolytes (NULYTELY/GOLYTELY) 420 G solution Take 4,000 mLs by mouth once. 01/27/15   Ozella Rocks, MD  prazosin (MINIPRESS) 1 MG capsule Take by mouth.    [provider]  QUEtiapine (SEROQUEL) 100 MG tablet Take by mouth.    [provider]  traZODone (DESYREL) 100 MG tablet Take 100 mg by mouth at bedtime.    [provider]     Allergies Almond oil; Fish allergy; Other; Peanuts [peanut oil]; and Pollen extract   No family history on file.  Social History Social  History   Tobacco Use  . Smoking status: Current Every Day Smoker  . Smokeless tobacco: Current User  Substance Use Topics  . Alcohol use: No  . Drug use: No    Review of Systems  Constitutional:   No fever or chills.  ENT:   No sore throat. No rhinorrhea. Cardiovascular:   No chest pain or syncope. Respiratory:   No dyspnea or cough. Gastrointestinal:   Negative for abdominal pain, vomiting and diarrhea.  Musculoskeletal:   Negative for focal pain or  swelling All other systems reviewed and are negative except as documented above in ROS and HPI.  ____________________________________________   PHYSICAL EXAM:  VITAL SIGNS: ED Triage Vitals  Enc Vitals Group     BP 07/30/17 1850 130/69     Pulse Rate 07/30/17 1900 84     Resp 07/30/17 1900 19     Temp --      Temp src --      SpO2 07/30/17 1900 97 %     Weight 07/30/17 1847 150 lb (68 kg)     Height 07/30/17 1847 5\' 10"  (1.778 m)     Head Circumference --      Peak Flow --      Pain Score 07/30/17 1900 7     Pain Loc --      Pain Edu? --      Excl. in GC? --     Vital signs reviewed, nursing assessments reviewed.   Constitutional:   Alert and oriented. Well appearing and in no distress. Eyes:   No scleral icterus.  EOMI. No nystagmus. No conjunctival pallor. PERRL. ENT   Head:   Normocephalic and atraumatic.   Nose:   No congestion/rhinnorhea.    Mouth/Throat:   MMM, no pharyngeal erythema. No peritonsillar mass.small area of tongue abrasion on the left lateral aspect. Hemostatic. No tongue hematoma   Neck:   No meningismus. Full ROM. Hematological/Lymphatic/Immunilogical:   No cervical lymphadenopathy. Cardiovascular:   RRR. Symmetric bilateral radial and DP pulses.  No murmurs.  Respiratory:   Normal respiratory effort without tachypnea/retractions. Breath sounds are clear and equal bilaterally. No wheezes/rales/rhonchi. Gastrointestinal:   Soft and nontender. Non distended. There is no CVA tenderness.  No rebound, rigidity, or guarding. Genitourinary:   deferred Musculoskeletal:   Normal range of motion in all extremities. No joint effusions.  No lower extremity tenderness.  No edema. Neurologic:   Normal speech and language.  Motor grossly intact. No acute focal neurologic deficits are appreciated.  Skin:    Skin is warm, dry and intact. No rash noted.  No petechiae, purpura, or bullae.  ____________________________________________    LABS  (pertinent positives/negatives) (all labs ordered are listed, but only abnormal results are displayed) Labs Reviewed - No data to display ____________________________________________   EKG    ____________________________________________    RADIOLOGY  No results found.  ____________________________________________   PROCEDURES Procedures  ____________________________________________    CLINICAL IMPRESSION / ASSESSMENT AND PLAN / ED COURSE  Pertinent labs & imaging results that were available during my care of the patient were reviewed by me and considered in my medical decision making (see chart for details).     Clinical Course as of Jul 30 2057  Sat Jul 30, 2017  1911 Pt well appearing, nad. Unclear if he had a seizure. Will d/w group home staff who witnessed concerning event. VS nl.  [PS]  2021  Discussed with care giver who witnessed the seizure. Sounds convincing for epileptic  seizure, with dystonic prodrome, followed by loss of consciousness and tone and convulsive activity with drooling. With increased seizure frequency, no plan to hospitalize for further monitoring and neurologic workup. would start increased dose of Lamictal 225 mg twice a day. Continue other home medications.  [PS]    Clinical Course User Index [PS] Sharman CheekStafford, Shanequia Kendrick, MD     ----------------------------------------- 8:58 PM on 07/30/2017 -----------------------------------------  Discussed with hospitalist, plan to hospitalize her overnight for continued monitoring given recurrent seizures today.  ____________________________________________   FINAL CLINICAL IMPRESSION(S) / ED DIAGNOSES    Final diagnoses:  Seizures [facility](HCC)     ED Discharge Orders    None      Portions of this note were generated with dragon dictation software. Dictation errors may occur despite best attempts at proofreading.    Sharman CheekStafford, Josette Shimabukuro, MD 07/30/17 2059

## 2017-07-30 NOTE — ED Notes (Addendum)
Pt's legal guardian Jyl HeinzChavis called and left message notifying him that patient was up for discharge at this time. According to nurse note patient to be picked up by either Melissa or [name].

## 2017-07-30 NOTE — Discharge Instructions (Addendum)
Your evaluated after reported seizure, and are back to normal now.  After discussion with the on-call neurologist, Dr. Thad Rangereynolds, she recommends increasing her dose from 200 mg twice a day to 225 mg twice per day.  You are being prescribed an additional prescription for 25 mg tablets that he can take an additional 1 in addition to your current Lamictal dosing.  You then need to follow-up with your primary care as well as your neurologist.  Return to the emergency room immediately for any worsening condition including confusion or altered mental status, fever, agitation, weakness, numbness, or any other symptoms concerning to you.

## 2017-07-30 NOTE — ED Notes (Signed)
Pt very sleepy at this time. Handout given to Dynegyngela RN

## 2017-07-30 NOTE — ED Notes (Addendum)
Called [employer] and got address for pt. Address given to [employer] to make house call and let them know that pt is ready to be discharged and that we have been trying to call. Per [employer] officer Pascal call has been made and officer to arrive soon.

## 2017-07-30 NOTE — ED Notes (Signed)
[name] from group home called to check on patient. Informed her we are waiting on rest of lab work and pt should be able to be d/c. Per [name] pts Legal Guardian is Chavis, phone number is [phone].   Per South RoxanaShannel either she or Efraim KaufmannMelissa will take pt to group home  Melissa: [phone]

## 2017-07-30 NOTE — ED Notes (Addendum)
Attempted to call legal guardian Clinton SawyerByron White and left message on answering machine stating that pt was here in ED.

## 2017-07-30 NOTE — ED Triage Notes (Signed)
Pt comes via [facility] from Group Home after wtinessed seizure. EMS states pt had seizure and fell onto the floor. Pt is alert and oriented and no complaints at this time. EMS states BS-117, 120/59, 99% room air and Hr-110.

## 2017-07-31 DIAGNOSIS — R569 Unspecified convulsions: Secondary | ICD-10-CM | POA: Diagnosis not present

## 2017-07-31 LAB — BASIC METABOLIC PANEL
Anion gap: 10 (ref 5–15)
BUN: 14 mg/dL (ref 6–20)
CHLORIDE: 107 mmol/L (ref 101–111)
CO2: 24 mmol/L (ref 22–32)
Calcium: 9.4 mg/dL (ref 8.9–10.3)
Creatinine, Ser: 0.89 mg/dL (ref 0.61–1.24)
GFR calc Af Amer: >60 mL/min (ref >60)
GFR calc non Af Amer: >60 mL/min (ref >60)
Glucose, Bld: 94 mg/dL (ref 65–99)
POTASSIUM: 3.9 mmol/L (ref 3.5–5.1)
Sodium: 141 mmol/L (ref 135–145)

## 2017-07-31 LAB — CBC
HEMATOCRIT: 41.5 % (ref 40.0–52.0)
Hemoglobin: 14 g/dL (ref 13.0–18.0)
MCH: 30.8 pg (ref 26.0–34.0)
MCHC: 33.7 g/dL (ref 32.0–36.0)
MCV: 91.3 fL (ref 80.0–100.0)
Platelets: 198 10*3/uL (ref 150–440)
RBC: 4.55 MIL/uL (ref 4.40–5.90)
RDW: 13.2 % (ref 11.5–14.5)
WBC: 8.3 10*3/uL (ref 3.8–10.6)

## 2017-07-31 MED ORDER — LAMOTRIGINE 25 MG PO TABS: 25.0000 mg | ORAL_TABLET | Freq: Two times a day (BID) | ORAL | 0 refills | Status: DC

## 2017-07-31 MED ORDER — LAMOTRIGINE 200 MG PO TABS: 200.0000 mg | ORAL_TABLET | Freq: Two times a day (BID) | ORAL | 2 refills | Status: AC

## 2017-07-31 NOTE — Consult Note (Signed)
Reason for Consult:Seizures Referring Physician: Sudini  CC: Seizures  HPI: Michael Pope is an 22 y.o. male with a history of seizures on Lamictal who presents with breakthrough seizures.  Patient followed by Dr. Malvin Johns and in October of last year due to breakthrough seizures had Lamictal increased to 200mg  BID.  Has done well on this until his presentation on yesterday with a breakthrough seizure.  Was seen in the ED and instructions given to increase Lamictal.  On the way home had 2 further seizures.  Was brought back to the ED and admitted.    Past Medical History:  Diagnosis Date  . Conduct disorder   . Disruptive mood dysregulation disorder (HCC)   . Mild intellectual disability   . PTSD (post-traumatic stress disorder)   . Seizures (HCC)     History reviewed. No pertinent surgical history.  Family History  Problem Relation Age of Onset  . CAD Father     Social History:  reports that  has never smoked. he has never used smokeless tobacco. He reports that he does not drink alcohol or use drugs.  Allergies  Allergen Reactions  . Almond Oil   . Fish Allergy   . Other     Other reaction(s): Unknown Almond, fish, peanuts  . Peanuts [Peanut Oil]   . Pollen Extract     Medications:  I have reviewed the patient's current medications. Prior to Admission:  Medications Prior to Admission  Medication Sig Dispense Refill Last Dose  . baclofen (LIORESAL) 10 MG tablet Take 1 tablet (10 mg total) by mouth 3 (three) times daily. 30 tablet 0 Taking  . EPINEPHrine 0.3 mg/0.3 mL IJ SOAJ injection Inject into the muscle.     Marland Kitchen FLUoxetine (PROZAC) 20 MG capsule Take 1 capsule (20 mg total) by mouth daily. 30 capsule 0 Taking  . prazosin (MINIPRESS) 1 MG capsule Take by mouth.   Taking  . QUEtiapine (SEROQUEL) 100 MG tablet Take by mouth.   Taking   Scheduled: . baclofen  10 mg Oral TID  . FLUoxetine  20 mg Oral Daily  . heparin  5,000 Units Subcutaneous Q8H  . lamoTRIgine  150  mg Oral BID  . prazosin  1 mg Oral QHS  . QUEtiapine  100 mg Oral QHS    ROS: History obtained from the patient  General ROS: negative for - chills, fatigue, fever, night sweats, weight gain or weight loss Psychological ROS: negative for - behavioral disorder, hallucinations, memory difficulties, mood swings or suicidal ideation Ophthalmic ROS: negative for - blurry vision, double vision, eye pain or loss of vision ENT ROS: negative for - epistaxis, nasal discharge, oral lesions, sore throat, tinnitus or vertigo Allergy and Immunology ROS: negative for - hives or itchy/watery eyes Hematological and Lymphatic ROS: negative for - bleeding problems, bruising or swollen lymph nodes Endocrine ROS: negative for - galactorrhea, hair pattern changes, polydipsia/polyuria or temperature intolerance Respiratory ROS: negative for - cough, hemoptysis, shortness of breath or wheezing Cardiovascular ROS: negative for - chest pain, dyspnea on exertion, edema or irregular heartbeat Gastrointestinal ROS: negative for - abdominal pain, diarrhea, hematemesis, nausea/vomiting or stool incontinence Genito-Urinary ROS: negative for - dysuria, hematuria, incontinence or urinary frequency/urgency Musculoskeletal ROS: negative for - joint swelling or muscular weakness Neurological ROS: as noted in HPI Dermatological ROS: negative for rash and skin lesion changes  Physical Examination: Blood pressure (!) 138/57, pulse 90, temperature 99 F (37.2 C), temperature source Oral, resp. rate 18, height 5\' 10"  (1.778 m),  weight 72.8 kg (160 lb 6.4 oz), SpO2 98 %.  HEENT-  Normocephalic, no lesions, without obvious abnormality.  Normal external eye and conjunctiva.  Normal TM's bilaterally.  Normal auditory canals and external ears. Normal external nose, mucus membranes and septum.  Normal pharynx. Cardiovascular- S1, S2 normal, pulses palpable throughout   Lungs- chest clear, no wheezing, rales, normal symmetric air  entry Abdomen- soft, non-tender; bowel sounds normal; no masses,  no organomegaly Extremities- no edema Lymph-no adenopathy palpable Musculoskeletal-no joint tenderness, deformity or swelling Skin-warm and dry, no hyperpigmentation, vitiligo, or suspicious lesions  Neurological Examination   Mental Status: Alert, oriented, thought content appropriate.  Speech fluent without evidence of aphasia.  Able to follow 3 step commands without difficulty. Cranial Nerves: II: Discs flat bilaterally; Visual fields grossly normal, pupils equal, round, reactive to light and accommodation III,IV, VI: ptosis not present, extra-ocular motions intact bilaterally V,VII: smile symmetric, facial light touch sensation normal bilaterally VIII: hearing normal bilaterally IX,X: gag reflex present XI: bilateral shoulder shrug XII: midline tongue extension Motor: Right : Upper extremity   5/5    Left:     Upper extremity   5/5  Lower extremity   5/5     Lower extremity   5/5 Tone and bulk:normal tone throughout; no atrophy noted Sensory: Pinprick and light touch intact throughout, bilaterally Deep Tendon Reflexes: 2+ and symmetric with absent AJ's bilaterally Plantars: Right: downgoing   Left: downgoing Cerebellar: Normal finger-to-nose and normal heel-to-shin testing bilaterally Gait: not tested due to safety concerns    Laboratory Studies:   Basic Metabolic Panel: Recent Labs  Lab 07/30/17 1135 07/31/17 0522  NA 140 141  K 3.9 3.9  CL 106 107  CO2 24 24  GLUCOSE 82 94  BUN 11 14  CREATININE 0.94 0.89  CALCIUM 9.7 9.4    Liver Function Tests: No results for input(s): AST, ALT, ALKPHOS, BILITOT, PROT, ALBUMIN in the last 168 hours. No results for input(s): LIPASE, AMYLASE in the last 168 hours. No results for input(s): AMMONIA in the last 168 hours.  CBC: Recent Labs  Lab 07/30/17 1135 07/31/17 0522  WBC 5.2 8.3  NEUTROABS 3.7  --   HGB 14.2 14.0  HCT 43.6 41.5  MCV 92.9 91.3  PLT  184 198    Cardiac Enzymes: No results for input(s): CKTOTAL, CKMB, CKMBINDEX, TROPONINI in the last 168 hours.  BNP: Invalid input(s): POCBNP  CBG: No results for input(s): GLUCAP in the last 168 hours.  Microbiology: No results found for this or any previous visit.  Coagulation Studies: No results for input(s): LABPROT, INR in the last 72 hours.  Urinalysis: No results for input(s): COLORURINE, LABSPEC, PHURINE, GLUCOSEU, HGBUR, BILIRUBINUR, KETONESUR, PROTEINUR, UROBILINOGEN, NITRITE, LEUKOCYTESUR in the last 168 hours.  Invalid input(s): APPERANCEUR  Lipid Panel:  No results found for: CHOL, TRIG, HDL, CHOLHDL, VLDL, LDLCALC  HgbA1C: No results found for: HGBA1C  Urine Drug Screen:      Component Value Date/Time   LABOPIA NONE DETECTED 07/30/2017 1327   COCAINSCRNUR NONE DETECTED 07/30/2017 1327   LABBENZ NONE DETECTED 07/30/2017 1327   AMPHETMU NONE DETECTED 07/30/2017 1327   THCU NONE DETECTED 07/30/2017 1327   LABBARB NONE DETECTED 07/30/2017 1327    Alcohol Level: No results for input(s): ETH in the last 168 hours.  Other results: EKG: sinus rhythm at 81 bpm.  Imaging: No results found.   Assessment/Plan: 22 year old male with a history of seizures presenting with breakthrough seizures.  Some question  as to dosing of Lamictal (150mg  BID versus 200mg  BID).  Patient has been stable overnight with no further seizures noted.  Recommendations: 1.  Confirm dose of Lamictal and increase by 25mg  BID.  Patient to follow up with outpatient neurologist.  Thana Farr, MD Neurology [phone] 07/31/2017, 2:48 PM

## 2017-07-31 NOTE — Discharge Instructions (Signed)
Resume diet and activity as before ° ° °

## 2017-07-31 NOTE — Plan of Care (Addendum)
Patient is a new admit came from the ED with admitting diagnosis of seizure. Routine admission care provided, oriented patient to his room. Patient is alert and oriented, able to make needs known. PIV to his R AC intact and patent. He is on PO anti-seizure medicine and pending for neurology consult. Patient placed on seizure and fall precautions, seizure pads applied to side rails. Refused his due heparin dose for VTE. Needs attended, kept safe and comfortable. No seizure activity noted thus far.

## 2017-07-31 NOTE — Progress Notes (Signed)
[facility] [place] [facility]   PATIENT NAME: Michael Pope    MR#:  [id number]  DATE OF BIRTH:  06/05/1995  SUBJECTIVE:  CHIEF COMPLAINT:   Chief Complaint  Patient presents with  . Seizures  No further seizures afebrile  REVIEW OF SYSTEMS:    Review of Systems  Constitutional: Positive for malaise/fatigue. Negative for chills and fever.  HENT: Negative for sore throat.   Eyes: Negative for blurred vision, double vision and pain.  Respiratory: Negative for cough, hemoptysis, shortness of breath and wheezing.   Cardiovascular: Negative for chest pain, palpitations, orthopnea and leg swelling.  Gastrointestinal: Negative for abdominal pain, constipation, diarrhea, heartburn, nausea and vomiting.  Genitourinary: Negative for dysuria and hematuria.  Musculoskeletal: Negative for back pain and joint pain.  Skin: Negative for rash.  Neurological: Positive for weakness. Negative for sensory change, speech change, focal weakness and headaches.  Endo/Heme/Allergies: Does not bruise/bleed easily.  Psychiatric/Behavioral: Negative for depression. The patient is not nervous/anxious.     DRUG ALLERGIES:   Allergies  Allergen Reactions  . Almond Oil   . Fish Allergy   . Other     Other reaction(s): Unknown Almond, fish, peanuts  . Peanuts [Peanut Oil]   . Pollen Extract     VITALS:  Blood pressure (!) 153/71, pulse 78, temperature 99.1 F (37.3 C), temperature source Oral, resp. rate 18, height  (1.778 m), weight 72.8 kg (160 lb 6.4 oz), SpO2 97 %.  PHYSICAL EXAMINATION:   Physical Exam  GENERAL:  22 y.o.-year-old patient lying in the bed with no acute distress.  EYES: Pupils equal, round, reactive to light and accommodation. No scleral icterus. Extraocular muscles intact.  HEENT: Head atraumatic, normocephalic. Oropharynx and nasopharynx clear.  NECK:  Supple, no jugular venous distention. No thyroid enlargement, no tenderness.  LUNGS:  Normal breath sounds bilaterally, no wheezing, rales, rhonchi. No use of accessory muscles of respiration.  CARDIOVASCULAR: S1, S2 normal. No murmurs, rubs, or gallops.  ABDOMEN: Soft, nontender, nondistended. Bowel sounds present. No organomegaly or mass.  EXTREMITIES: No cyanosis, clubbing or edema b/l.    NEUROLOGIC: Cranial nerves II through XII are intact. No focal Motor or sensory deficits b/l.   PSYCHIATRIC: The patient is alert and oriented x 3.  SKIN: No obvious rash, lesion, or ulcer.   LABORATORY PANEL:   CBC Recent Labs  Lab 07/31/17 0522  WBC 8.3  HGB 14.0  HCT 41.5  PLT 198   ------------------------------------------------------------------------------------------------------------------ Chemistries  Recent Labs  Lab 07/31/17 0522  NA 141  K 3.9  CL 107  CO2 24  GLUCOSE 94  BUN 14  CREATININE 0.89  CALCIUM 9.4   ------------------------------------------------------------------------------------------------------------------  Cardiac Enzymes No results for input(s): TROPONINI in the last 168 hours. ------------------------------------------------------------------------------------------------------------------  RADIOLOGY:  No results found.   ASSESSMENT AND PLAN:   * Recurrent seizures Discussed with Dr. Thad Ranger. Increase lamictal by 25 mg BID. This will be 225 mg BID at discharge. Unable to discharge back to group home due to it being late in the day and SW and CM not available. Seizure free    All the records are reviewed and case discussed with Care Management/Social Workerr. Management plans discussed with the patient, family and they are in agreement.  CODE STATUS: FULL CODE  DVT Prophylaxis: SCDs  TOTAL TIME TAKING CARE OF THIS PATIENT: 30 minutes.   POSSIBLE D/C IN 1-2 DAYS, DEPENDING ON CLINICAL [facility] M.D on 07/31/2017 at 10:22 PM  Between 7am to 6pm - Pager - [phone]  After 6pm go to [email or web address]  - password [email or web address]  [facility]  Office  [phone]  CC: Primary care physician; Patient, No Pcp Per  Note: This dictation was prepared with Dragon dictation along with smaller phrase technology. Any transcriptional errors that result from this process are unintentional.

## 2017-07-31 NOTE — Progress Notes (Signed)
Dr. Elpidio AnisSudini called with request to d/c pt on 225 mg BID lamictal as advised by neurologist. I have given the prescriptions of that.

## 2017-08-01 MED ORDER — LAMOTRIGINE 25 MG PO TABS
225.0000 mg | ORAL_TABLET | Freq: Two times a day (BID) | ORAL | Status: DC
  Administered 2017-08-01: 225 mg via ORAL
  Filled 2017-08-01: qty 2

## 2017-08-01 NOTE — Progress Notes (Signed)
Pt was discharged today, discharge instructions reviewed with the patient and the guardian. All belongings packed and returned to him. They verified understanding 2 paper prescriptions were given to him. His group home caregiver picked him up, he walked out with them. All belongings packed and returned to him.

## 2017-08-01 NOTE — NC FL2 (Signed)
[place] MEDICAID FL2 LEVEL OF CARE SCREENING TOOL     IDENTIFICATION  Patient Name: Michael Pope Birthdate: [birth year] Sex: male Admission Date (Current Location): 07/30/2017  [place] and [place] Number:  [name] [id number] [address] Facility and Address:  [facility], [address], [place], [place] [address]      Provider Number: [id number]  Attending Physician Name and Address:  Altamese Dilling, *md  Relative Name and Phone Number:  Rise Mu   [phone] or NA, [phone]    Current Level of Care: Hospital Recommended Level of Care: Other (Comment)([facility] group home.) Prior Approval Number:    Date Approved/Denied:   PASRR Number:    Discharge Plan: Other (Comment)([facility])    Current Diagnoses: Patient Active Problem List   Diagnosis Date Noted  . Seizures (HCC) 07/30/2017  . Elevated LFTs 12/31/2016  . Nonintractable generalized idiopathic epilepsy without status epilepticus (HCC) 10/15/2016  . Seizure (HCC) 09/08/2016  . 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 Allergy to peanuts 04/29/2013    Orientation RESPIRATION BLADDER Height & Weight     Self, Time, Place, Situation  Normal Continent Weight: 160 lb 6.4 oz (72.8 kg) Height:  5\' 10"  (177.8 cm)  BEHAVIORAL SYMPTOMS/MOOD NEUROLOGICAL BOWEL NUTRITION STATUS    Convulsions/Seizures Continent Diet(Regular diet)  AMBULATORY STATUS COMMUNICATION OF NEEDS Skin   Independent Verbally Normal                       Personal Care Assistance Level of Assistance  Bathing, Feeding, Dressing Bathing Assistance: Independent Feeding assistance: Independent Dressing Assistance: Independent     Functional Limitations Info  Hearing, Speech, Sight Sight Info: Adequate Hearing Info: Adequate Speech Info: Adequate    SPECIAL CARE FACTORS FREQUENCY                       Contractures  Contractures Info: Not present    Additional Factors Info  Allergies   Allergies Info: ALMOND OIL, FISH ALLERGY, OTHER, PEANUTS PEANUT OIL, POLLEN EXTRACT            Current Medications (08/01/2017):  This is the current hospital active medication list Current Facility-Administered Medications  Medication Dose Route Frequency Provider Last Rate Last Dose  . baclofen (LIORESAL) tablet 10 mg  10 mg Oral TID Altamese Dilling, MD   10 mg at 08/01/17 1003  . docusate sodium (COLACE) capsule 100 mg  100 mg Oral BID PRN Altamese Dilling, MD      . FLUoxetine (PROZAC) capsule 20 mg  20 mg Oral Daily Altamese Dilling, MD   20 mg at 08/01/17 1003  . heparin injection 5,000 Units  5,000 Units Subcutaneous Q8H Altamese Dilling, MD      . lamoTRIgine (LAMICTAL) tablet 225 mg  225 mg Oral BID Altamese Dilling, MD   225 mg at 08/01/17 1003  . LORazepam (ATIVAN) injection 2 mg  2 mg Intravenous Q4H PRN Altamese Dilling, MD      . prazosin (MINIPRESS) capsule 1 mg  1 mg Oral QHS Altamese Dilling, MD   1 mg at 07/31/17 2146  . QUEtiapine (SEROQUEL) tablet 100 mg  100 mg Oral QHS Altamese Dilling, MD   100 mg at 07/31/17 2145     Discharge Medications: TAKE these medications   baclofen 10 MG tablet Commonly known as:  LIORESAL Take 1 tablet (10 mg total) by mouth 3 (three) times daily.   EPINEPHrine 0.3  mg/0.3 mL Soaj injection Commonly known as:  EPI-PEN Inject into the muscle.   FLUoxetine 20 MG capsule Commonly known as:  PROZAC Take 1 capsule (20 mg total) by mouth daily.   lamoTRIgine 25 MG tablet Commonly known as:  LAMICTAL Take 1 tablet (25 mg total) by mouth 2 (two) times daily. Along with 200 mg , as a total of 225 mg oral BID What changed:  additional instructions   lamoTRIgine 200 MG tablet Commonly known as:  LAMICTAL Take 1 tablet (200 mg total) by mouth 2 (two) times daily. What changed:  You were already taking a  medication with the same name, and this prescription was added. Make sure you understand how and when to take each.   prazosin 1 MG capsule Commonly known as:  MINIPRESS Take by mouth.   QUEtiapine 100 MG tablet Commonly known as:  SEROQUEL Take by mouth.    Relevant Imaging Results:  Relevant Lab Results:   Additional Information    [facility] [employer], Roselynn Whitacre R, LCSWA  08-01-17

## 2017-08-01 NOTE — Discharge Summary (Signed)
[facility] - [place] at [facility]   PATIENT NAME: Michael Pope    MR#:  [id number]  DATE OF BIRTH:  1996/02/21  DATE OF ADMISSION:  07/30/2017 ADMITTING PHYSICIAN: Altamese DillingVaibhavkumar Gracin Mcpartland, MD  DATE OF DISCHARGE: 08/01/2017   PRIMARY CARE PHYSICIAN: Patient, No Pcp Per    ADMISSION DIAGNOSIS:  Seizures (HCC) [R56.9]  DISCHARGE DIAGNOSIS:  Active Problems:   Seizures (HCC)   SECONDARY DIAGNOSIS:   Past Medical History:  Diagnosis Date  . Conduct disorder   . Disruptive mood dysregulation disorder (HCC)   . Mild intellectual disability   . PTSD (post-traumatic stress disorder)   . Seizures (HCC)     HOSPITAL COURSE:   * Recurrent seizures Discussed with Dr. Thad Rangereynolds. Increase lamictal by 25 mg BID. This will be 225 mg BID at discharge. D/c back to group home today.  No more seizures noted in hospital stay.   DISCHARGE CONDITIONS:   Stable.  CONSULTS OBTAINED:  Treatment Team:  Thana Farreynolds, Leslie, MD Kym Groomriadhosp, Neuro1, MD  DRUG ALLERGIES:   Allergies  Allergen Reactions  . Almond Oil   . Fish Allergy   . Other     Other reaction(s): Unknown Almond, fish, peanuts  . Peanuts [Peanut Oil]   . Pollen Extract     DISCHARGE MEDICATIONS:   Allergies as of 08/01/2017      Reactions   Almond Oil    Fish Allergy    Other    Other reaction(s): Unknown Almond, fish, peanuts   Peanuts [peanut Oil]    Pollen Extract       Medication List    TAKE these medications   baclofen 10 MG tablet Commonly known as:  LIORESAL Take 1 tablet (10 mg total) by mouth 3 (three) times daily.   EPINEPHrine 0.3 mg/0.3 mL Soaj injection Commonly known as:  EPI-PEN Inject into the muscle.   FLUoxetine 20 MG capsule Commonly known as:  PROZAC Take 1 capsule (20 mg total) by mouth daily.   lamoTRIgine 25 MG tablet Commonly known as:  LAMICTAL Take 1 tablet (25 mg total) by mouth 2 (two) times daily. Along with 200 mg , as a total of 225 mg  oral BID What changed:  additional instructions   lamoTRIgine 200 MG tablet Commonly known as:  LAMICTAL Take 1 tablet (200 mg total) by mouth 2 (two) times daily. What changed:  You were already taking a medication with the same name, and this prescription was added. Make sure you understand how and when to take each.   prazosin 1 MG capsule Commonly known as:  MINIPRESS Take by mouth.   QUEtiapine 100 MG tablet Commonly known as:  SEROQUEL Take by mouth.        DISCHARGE INSTRUCTIONS:    Follow with PMD in 1-2 weeks.  If you experience worsening of your admission symptoms, develop shortness of breath, life threatening emergency, suicidal or homicidal thoughts you must seek medical attention immediately by calling 911 or calling your MD immediately  if symptoms less severe.  You Must read complete instructions/literature along with all the possible adverse reactions/side effects for all the Medicines you take and that have been prescribed to you. Take any new Medicines after you have completely understood and accept all the possible adverse reactions/side effects.   Please note  You were cared for by a hospitalist during your hospital stay. If you have any questions about your discharge medications or the care you received while you were in the hospital  after you are discharged, you can call the unit and asked to speak with the hospitalist on call if the hospitalist that took care of you is not available. Once you are discharged, your primary care physician will handle any further medical issues. Please note that NO REFILLS for any discharge medications will be authorized once you are discharged, as it is imperative that you return to your primary care physician (or establish a relationship with a primary care physician if you do not have one) for your aftercare needs so that they can reassess your need for medications and monitor your lab values.    Today   CHIEF COMPLAINT:    Chief Complaint  Patient presents with  . Seizures    HISTORY OF PRESENT ILLNESS:  Michael Pope  is a 22 y.o. male with a known history of seizure disorder, posttraumatic stress disorder- is on seizure medications since long time and his last seizures was a few years ago. Today morning came to ER with episode of seizure. ER physician had discharged him with advice to increase the dose of seizure medications. Patient was sent home with group home staff , while on the way he had 2 brief seizures episodes again so brought back to the emergency room.  Patient denies missing any doses of antiseizure medications, denies sleep deprivation or excessive use of energy drink or coffee in last few days.He denies any use of drugs in last few days. As he came back again to ER with seizure, suggested now to admit him under medical service.   VITAL SIGNS:  Blood pressure (!) 153/48, pulse 76, temperature 98.1 F (36.7 C), temperature source Oral, resp. rate 18, height 5\' 10"  (1.778 m), weight 72.8 kg (160 lb 6.4 oz), SpO2 98 %.  I/O:    Intake/Output Summary (Last 24 hours) at 08/01/2017 0742 Last data filed at 07/31/2017 1300 Gross per 24 hour  Intake 240 ml  Output -  Net 240 ml    PHYSICAL EXAMINATION:  GENERAL:  22 y.o.-year-old patient lying in the bed with no acute distress.  EYES: Pupils equal, round, reactive to light and accommodation. No scleral icterus. Extraocular muscles intact.  HEENT: Head atraumatic, normocephalic. Oropharynx and nasopharynx clear.  NECK:  Supple, no jugular venous distention. No thyroid enlargement, no tenderness.  LUNGS: Normal breath sounds bilaterally, no wheezing, rales,rhonchi or crepitation. No use of accessory muscles of respiration.  CARDIOVASCULAR: S1, S2 normal. No murmurs, rubs, or gallops.  ABDOMEN: Soft, non-tender, non-distended. Bowel sounds present. No organomegaly or mass.  EXTREMITIES: No pedal edema, cyanosis, or clubbing.  NEUROLOGIC:  Cranial nerves II through XII are intact. Muscle strength 5/5 in all extremities. Sensation intact. Gait not checked.  PSYCHIATRIC: The patient is alert and oriented x 3.  SKIN: No obvious rash, lesion, or ulcer.   DATA REVIEW:   CBC Recent Labs  Lab 07/31/17 0522  WBC 8.3  HGB 14.0  HCT 41.5  PLT 198    Chemistries  Recent Labs  Lab 07/31/17 0522  NA 141  K 3.9  CL 107  CO2 24  GLUCOSE 94  BUN 14  CREATININE 0.89  CALCIUM 9.4    Cardiac Enzymes No results for input(s): TROPONINI in the last 168 hours.  Microbiology Results  No results found for this or any previous visit.  RADIOLOGY:  No results found.  EKG:   Orders placed or performed during the hospital encounter of 07/30/17  . EKG 12-Lead  . EKG 12-Lead  Management plans discussed with the patient, family and they are in agreement.  CODE STATUS:     Code Status Orders  (From admission, onward)        Start     Ordered   07/30/17 2216  Full code  Continuous     07/30/17 2215    Code Status History    Date Active Date Inactive Code Status Order ID Comments User Context   This patient has a current code status but no historical code status.      TOTAL TIME TAKING CARE OF THIS PATIENT: 35 minutes.    Altamese Dilling M.D on 08/01/2017 at 7:42 AM  Between 7am to 6pm - Pager - [phone]  After 6pm go to [email or web address] - password [email or web address]  [facility]  Office  [phone]  CC: Primary care physician; Patient, No Pcp Per   Note: This dictation was prepared with Dragon dictation along with smaller phrase technology. Any transcriptional errors that result from this process are unintentional.

## 2017-08-01 NOTE — Clinical Social Work Note (Signed)
Clinical Social Work Assessment  Patient Details  Name: Michael Pope MRN: [id number] Date of Birth: 1996/02/11  Date of referral:  08/01/17               Reason for consult:  Facility Placement                Permission sought to share information with:  Facility Medical sales representativeContact Representative Permission granted to share information::  Yes, Verbal Permission Granted  Name::     White,Byron Other   [phone] or Tobie PoetA, Shaneal Other   [phone]   Agency::  Group Home  Relationship::     Contact Information:     Housing/Transportation Living arrangements for the past 2 months:  Group Home Source of Information:  Medical Team Patient Interpreter Needed:  None Criminal Activity/Legal Involvement Pertinent to Current Situation/Hospitalization:    Significant Relationships:  Merchandiser, retailCommunity Support, Other(Comment)(Legal Guardian) Lives with:  Other (Comment)(Group Home) Do you feel safe going back to the place where you live?  Yes Need for family participation in patient care:  Yes (Comment)  Care giving concerns:  Patient did not express any concerns about returning back to group home.   Social Worker assessment / plan:  Patient is a 22 year old male who is alert and oriented x4.  Patient is from [facility], he has been there for several months.  Patient was admitted for seizures, patient was explained role of CSW and process for helping him return back to group home.  CSW contacted group home to inform them that patient will be discharging today, group home said they will be her to pick up patient around 12pm.  Patient did not express any other questions or concerns.  Employment status:  Unemployed, Disabled (Comment on whether or not currently receiving Disability) Insurance information:  [place] Recommendations:  Not assessed at this time Information / Referral to community resources:     Patient/Family's Response to care:  Patient is eager to return back to group  home.  Patient/Family's Understanding of and Emotional Response to Diagnosis, Current Treatment, and Prognosis:  Patient is aware of current diagnosis and current treatment plan.  Emotional Assessment Appearance:  Appears stated age Attitude/Demeanor/Rapport:    Affect (typically observed):  Appropriate, Calm Orientation:  Oriented to Self, Oriented to  Time, Oriented to Place, Oriented to Situation Alcohol / Substance use:  Not Applicable Psych involvement (Current and /or in the community):  Yes (Comment)  Discharge Needs  Concerns to be addressed:  Care Coordination Readmission within the last 30 days:  No Current discharge risk:  None Barriers to Discharge:  No Barriers Identified   Darleene Cleavernterhaus, Jacquelin Krajewski R, LCSWA 08/01/2017, 3:15 PM

## 2017-08-01 NOTE — Care Management (Signed)
CM consult regarding patient being from a group home with legal guardian.  CSW will coordinate return.

## 2017-08-01 NOTE — Clinical Social Work Note (Signed)
Patient is from [facility], patient will be returning today.  [facility] [phone] and he said they will be able to pick patient up around 12:00pm today.  Ervin KnackEric R. Donya Hitch, MSW, Theresia MajorsLCSWA [phone]  08/01/2017 12:21 PM

## 2017-09-20 ENCOUNTER — Encounter: Payer: Self-pay | Admitting: Emergency Medicine

## 2017-09-20 ENCOUNTER — Emergency Department
Admission: EM | Admit: 2017-09-20 | Discharge: 2017-09-20 | Disposition: A | Discharge status: Home / Self Care | Payer: Medicaid Other | Attending: Emergency Medicine | Admitting: Emergency Medicine

## 2017-09-20 DIAGNOSIS — Z79899 Other long term (current) drug therapy: Secondary | ICD-10-CM | POA: Diagnosis not present

## 2017-09-20 DIAGNOSIS — W25XXXA Contact with sharp glass, initial encounter: Secondary | ICD-10-CM | POA: Insufficient documentation

## 2017-09-20 DIAGNOSIS — S60947A Unspecified superficial injury of left little finger, initial encounter: Secondary | ICD-10-CM | POA: Diagnosis present

## 2017-09-20 DIAGNOSIS — Y939 Activity, unspecified: Secondary | ICD-10-CM | POA: Diagnosis not present

## 2017-09-20 DIAGNOSIS — Z23 Encounter for immunization: Secondary | ICD-10-CM | POA: Insufficient documentation

## 2017-09-20 DIAGNOSIS — Z91010 Allergy to peanuts: Secondary | ICD-10-CM | POA: Diagnosis not present

## 2017-09-20 DIAGNOSIS — Y929 Unspecified place or not applicable: Secondary | ICD-10-CM | POA: Diagnosis not present

## 2017-09-20 DIAGNOSIS — S61217A Laceration without foreign body of left little finger without damage to nail, initial encounter: Secondary | ICD-10-CM

## 2017-09-20 DIAGNOSIS — Y999 Unspecified external cause status: Secondary | ICD-10-CM | POA: Insufficient documentation

## 2017-09-20 MED ORDER — LIDOCAINE HCL (PF) 1 % IJ SOLN: 5.0000 mL | Freq: Once | INTRAMUSCULAR | Status: DC

## 2017-09-20 MED ORDER — CEPHALEXIN 500 MG PO CAPS: 500.0000 mg | ORAL_CAPSULE | Freq: Four times a day (QID) | ORAL | 0 refills | Status: AC

## 2017-09-20 MED ORDER — LIDOCAINE HCL (PF) 1 % IJ SOLN
INTRAMUSCULAR | Status: AC
  Filled 2017-09-20: qty 5

## 2017-09-20 MED ORDER — LIDOCAINE 5 % EX PTCH
MEDICATED_PATCH | CUTANEOUS | Status: AC
  Filled 2017-09-20: qty 1

## 2017-09-20 MED ORDER — TETANUS-DIPHTH-ACELL PERTUSSIS 5-2.5-18.5 LF-MCG/0.5 IM SUSP
0.5000 mL | Freq: Once | INTRAMUSCULAR | Status: AC
  Administered 2017-09-20: 0.5 mL via INTRAMUSCULAR
  Filled 2017-09-20: qty 0.5

## 2017-09-20 NOTE — ED Triage Notes (Signed)
Presents with laceration to right hand   States he was throwing a piece of broken glass  And cut finger

## 2017-09-20 NOTE — ED Provider Notes (Signed)
[facility] Emergency Department Provider Note  ____________________________________________  Time seen: Approximately 6:31 PM  I have reviewed the triage vital signs and the nursing notes.   HISTORY  Chief Complaint Laceration    HPI Michael Pope is a 22 y.o. male presents emergency department for evaluation of left finger laceration.  Patient was trying to throw a beer bottle  When he cut his finger.  No additional injuries.  Past Medical History:  Diagnosis Date  . Conduct disorder   . Disruptive mood dysregulation disorder (HCC)   . Mild intellectual disability   . PTSD (post-traumatic stress disorder)   . Seizures [facility])     Patient Active Problem List   Diagnosis Date Noted  . Seizures (HCC) 07/30/2017  . Elevated LFTs 12/31/2016  . Nonintractable generalized idiopathic epilepsy without status epilepticus (HCC) 10/15/2016  . Seizure (HCC) 09/08/2016  . 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 Allergy to peanuts 04/29/2013    History reviewed. No pertinent surgical history.  Prior to Admission medications   Medication Sig Start Date End Date Taking? Authorizing Provider  baclofen (LIORESAL) 10 MG tablet Take 1 tablet (10 mg total) by mouth 3 (three) times daily. 04/17/16   Beers, Charmayne Sheer, PA-C  cephALEXin (KEFLEX) 500 MG capsule Take 1 capsule (500 mg total) by mouth 4 (four) times daily for 7 days. 09/20/17 09/27/17  Enid Derry, PA-C  EPINEPHrine 0.3 mg/0.3 mL IJ SOAJ injection Inject into the muscle.    [provider]  FLUoxetine (PROZAC) 20 MG capsule Take 1 capsule (20 mg total) by mouth daily. 05/12/16   Irean Hong, MD  lamoTRIgine (LAMICTAL) 200 MG tablet Take 1 tablet (200 mg total) by mouth 2 (two) times daily. 07/31/17 07/31/18  Altamese Dilling, MD  lamoTRIgine (LAMICTAL) 25 MG tablet Take 1 tablet (25 mg total) by mouth 2 (two) times daily. Along with 200 mg , as a total  of 225 mg oral BID 07/31/17 07/31/18  Altamese Dilling, MD  prazosin (MINIPRESS) 1 MG capsule Take by mouth.    [provider]  QUEtiapine (SEROQUEL) 100 MG tablet Take by mouth.    [provider]    Allergies Almond oil; Fish allergy; Other; Peanuts [peanut oil]; and Pollen extract  Family History  Problem Relation Age of Onset  . CAD Father     Social History Social History   Tobacco Use  . Smoking status: Never Smoker  . Smokeless tobacco: Never Used  Substance Use Topics  . Alcohol use: No  . Drug use: No     Review of Systems  Constitutional: No fever/chills Gastrointestinal: No nausea, no vomiting.  Musculoskeletal: Positive for finger pain. Skin: Negative for rash, ecchymosis. Positive for laceration. Neurological: Negative for numbness or tingling   ____________________________________________   PHYSICAL EXAM:  VITAL SIGNS: ED Triage Vitals [09/20/17 1828]  Enc Vitals Group     BP (!) 163/85     Pulse Rate 82     Resp 20     Temp 98.3 F (36.8 C)     Temp Source Oral     SpO2 99 %     Weight      Height      Head Circumference      Peak Flow      Pain Score      Pain Loc      Pain Edu?      Excl. in GC?  Constitutional: Alert and oriented. Well appearing and in no acute distress. Eyes: Conjunctivae are normal. PERRL. EOMI. Head: Atraumatic. ENT:      Ears:      Nose: No congestion/rhinnorhea.      Mouth/Throat: Mucous membranes are moist.  Neck: No stridor.  Cardiovascular: Good peripheral circulation. Respiratory: Normal respiratory effort without tachypnea or retractions.  Musculoskeletal: Full range of motion to all extremities. No gross deformities appreciated.  Able to perform resisted flexion and extension. Neurologic:  Normal speech and language. No gross focal neurologic deficits are appreciated.  Skin:  Skin is warm, dry.  2 cm curved flap laceration to distal left little finger.  Psychiatric: Mood  and affect are normal. Speech and behavior are normal. Patient exhibits appropriate insight and judgement.   ____________________________________________   LABS (all labs ordered are listed, but only abnormal results are displayed)  Labs Reviewed - No data to display ____________________________________________  EKG   ____________________________________________  RADIOLOGY pain    No results found.  ____________________________________________    PROCEDURES  Procedure(s) performed:   LACERATION REPAIR Performed by: Enid Derry  Consent: Verbal consent obtained.  Consent given by: patient  Prepped and Draped in normal sterile fashion  Wound explored: No foreign bodies   Laceration Location: little finger  Laceration Length: 2 cm  Anesthesia: None  Local anesthetic: lidocaine 1% without epinephrine  Anesthetic total: 3 ml  Irrigation method: syringe  Amount of cleaning: normal saline  Skin closure: 4-0 nylon  Number of sutures: 5  Technique: Simple interrupted  Patient tolerance: Patient tolerated the procedure well with no immediate complications.  Procedures    Medications  lidocaine (LIDODERM) 5 % (has no administration in time range)  Tdap (BOOSTRIX) injection 0.5 mL (has no administration in time range)  lidocaine (PF) (XYLOCAINE) 1 % injection 5 mL (has no administration in time range)     ____________________________________________   INITIAL IMPRESSION / ASSESSMENT AND PLAN / ED COURSE  Pertinent labs & imaging results that were available during my care of the patient were reviewed by me and considered in my medical decision making (see chart for details).  Review of the [place] CSRS was performed in accordance of the [facility] prior to dispensing any controlled drugs.     Patient's diagnosis is consistent with finger laceration.  Vital signs and exam are reassuring. Laceration with repaired with stitches. Patient will be  discharged home with prescriptions for keflex. Patient is to follow up with PCP as directed. Patient is given ED precautions to return to the ED for any worsening or new symptoms.     ____________________________________________  FINAL CLINICAL IMPRESSION(S) / ED DIAGNOSES  Final diagnoses:  Laceration of left little finger without foreign body without damage to nail, initial encounter      NEW MEDICATIONS STARTED DURING THIS VISIT:  ED Discharge Orders        Ordered    cephALEXin (KEFLEX) 500 MG capsule  4 times daily     09/20/17 1847          This chart was dictated using voice recognition software/Dragon. Despite best efforts to proofread, errors can occur which can change the meaning. Any change was purely unintentional.    Enid Derry, PA-C 09/20/17 1850    Pershing Proud Myra Rude, MD 09/20/17 2227

## 2017-10-02 ENCOUNTER — Emergency Department
Admission: EM | Admit: 2017-10-02 | Discharge: 2017-10-02 | Disposition: A | Discharge status: Home / Self Care | Payer: Medicaid Other | Source: Home / Self Care | Attending: Emergency Medicine | Admitting: Emergency Medicine

## 2017-10-02 ENCOUNTER — Emergency Department
Admission: EM | Admit: 2017-10-02 | Discharge: 2017-10-02 | Disposition: A | Discharge status: Home / Self Care | Payer: Medicaid Other | Attending: Emergency Medicine | Admitting: Emergency Medicine

## 2017-10-02 ENCOUNTER — Other Ambulatory Visit: Payer: Self-pay

## 2017-10-02 DIAGNOSIS — R569 Unspecified convulsions: Secondary | ICD-10-CM | POA: Diagnosis present

## 2017-10-02 DIAGNOSIS — Z91010 Allergy to peanuts: Secondary | ICD-10-CM | POA: Diagnosis not present

## 2017-10-02 DIAGNOSIS — Z79899 Other long term (current) drug therapy: Secondary | ICD-10-CM | POA: Insufficient documentation

## 2017-10-02 DIAGNOSIS — G40909 Epilepsy, unspecified, not intractable, without status epilepticus: Secondary | ICD-10-CM

## 2017-10-02 LAB — COMPREHENSIVE METABOLIC PANEL
ALT: 25 U/L (ref 17–63)
ANION GAP: 7 (ref 5–15)
AST: 40 U/L (ref 15–41)
Albumin: 4.5 g/dL (ref 3.5–5.0)
Alkaline Phosphatase: 102 U/L (ref 38–126)
BUN: <5 mg/dL — ABNORMAL LOW (ref 6–20)
CALCIUM: 9 mg/dL (ref 8.9–10.3)
CHLORIDE: 103 mmol/L (ref 101–111)
CO2: 26 mmol/L (ref 22–32)
CREATININE: 0.99 mg/dL (ref 0.61–1.24)
GFR calc non Af Amer: >60 mL/min (ref >60)
GLUCOSE: 117 mg/dL — AB (ref 65–99)
Potassium: 3.6 mmol/L (ref 3.5–5.1)
SODIUM: 136 mmol/L (ref 135–145)
Total Bilirubin: 0.6 mg/dL (ref 0.3–1.2)
Total Protein: 7.3 g/dL (ref 6.5–8.1)

## 2017-10-02 LAB — CBC WITH DIFFERENTIAL/PLATELET
Basophils Absolute: 0 10*3/uL (ref 0–0.1)
Basophils Relative: 0 %
EOS ABS: 0 10*3/uL (ref 0–0.7)
Eosinophils Relative: 1 %
HEMATOCRIT: 36.5 % — AB (ref 40.0–52.0)
HEMOGLOBIN: 12.4 g/dL — AB (ref 13.0–18.0)
LYMPHS ABS: 1.4 10*3/uL (ref 1.0–3.6)
Lymphocytes Relative: 18 %
MCH: 31 pg (ref 26.0–34.0)
MCHC: 33.9 g/dL (ref 32.0–36.0)
MCV: 91.4 fL (ref 80.0–100.0)
MONO ABS: 0.5 10*3/uL (ref 0.2–1.0)
Monocytes Relative: 7 %
NEUTROS PCT: 74 %
Neutro Abs: 5.6 10*3/uL (ref 1.4–6.5)
Platelets: 270 10*3/uL (ref 150–440)
RBC: 3.99 MIL/uL — ABNORMAL LOW (ref 4.40–5.90)
RDW: 12.8 % (ref 11.5–14.5)
WBC: 7.5 10*3/uL (ref 3.8–10.6)

## 2017-10-02 MED ORDER — LAMOTRIGINE 25 MG PO TABS
250.0000 mg | ORAL_TABLET | Freq: Once | ORAL | Status: AC
  Administered 2017-10-02: 250 mg via ORAL
  Filled 2017-10-02: qty 2

## 2017-10-02 MED ORDER — LAMOTRIGINE 25 MG PO TABS: 50.0000 mg | ORAL_TABLET | Freq: Two times a day (BID) | ORAL | 0 refills | Status: AC

## 2017-10-02 MED ORDER — LAMOTRIGINE 25 MG PO TABS
25.0000 mg | ORAL_TABLET | ORAL | Status: AC
  Administered 2017-10-02: 25 mg via ORAL
  Filled 2017-10-02: qty 1

## 2017-10-02 NOTE — ED Notes (Signed)
David from group home will pick up pt. Pt d/c and awaiting ride in room.

## 2017-10-02 NOTE — ED Provider Notes (Signed)
[facility] Emergency Department Provider Note  ____________________________________________  Time seen: Approximately 1:16 PM  I have reviewed the triage vital signs and the nursing notes.   HISTORY  Chief Complaint Seizures    HPI Michael Pope is a 22 y.o. male brought to the ED for a seizure today. He came from his group home where he reports that he was waiting outside for a few others to come so they could all go inside for lunch and after that he doesn't remember what happens. currently denies any symptoms. No headache neck pain or vision changes. No paresthesias or weakness. Denies any recent illness. Reports compliance with his medications. Once to go home. He is looking forward to starting a job in 2 days.   no urinary incontinence  Past Medical History:  Diagnosis Date  . Conduct disorder   . Disruptive mood dysregulation disorder (HCC)   . Mild intellectual disability   . PTSD (post-traumatic stress disorder)   . Seizures [facility])      Patient Active Problem List   Diagnosis Date Noted  . Seizures (HCC) 07/30/2017  . Elevated LFTs 12/31/2016  . Nonintractable generalized idiopathic epilepsy without status epilepticus (HCC) 10/15/2016  . Seizure (HCC) 09/08/2016  . 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 Allergy to peanuts 04/29/2013     History reviewed. No pertinent surgical history.   Prior to Admission medications   Medication Sig Start Date End Date Taking? Authorizing Provider  baclofen (LIORESAL) 10 MG tablet Take 1 tablet (10 mg total) by mouth 3 (three) times daily. 04/17/16   Beers, Charmayne Sheer, PA-C  EPINEPHrine 0.3 mg/0.3 mL IJ SOAJ injection Inject into the muscle.    [provider]  FLUoxetine (PROZAC) 20 MG capsule Take 1 capsule (20 mg total) by mouth daily. 05/12/16   Irean Hong, MD  lamoTRIgine (LAMICTAL) 200 MG tablet Take 1 tablet (200 mg total) by mouth 2 (two)  times daily. 07/31/17 07/31/18  Altamese Dilling, MD  lamoTRIgine (LAMICTAL) 25 MG tablet Take 1 tablet (25 mg total) by mouth 2 (two) times daily. Along with 200 mg , as a total of 225 mg oral BID 07/31/17 07/31/18  Altamese Dilling, MD  prazosin (MINIPRESS) 1 MG capsule Take by mouth.    [provider]  QUEtiapine (SEROQUEL) 100 MG tablet Take by mouth.    [provider]     Allergies Almond oil; Fish allergy; Other; Peanuts [peanut oil]; and Pollen extract   Family History  Problem Relation Age of Onset  . CAD Father     Social History Social History   Tobacco Use  . Smoking status: Never Smoker  . Smokeless tobacco: Never Used  Substance Use Topics  . Alcohol use: No  . Drug use: No    Review of Systems  Constitutional:   No fever or chills.  ENT:   No sore throat. No rhinorrhea. Cardiovascular:   No chest pain or syncope. Respiratory:   No dyspnea or cough. Gastrointestinal:   Negative for abdominal pain, vomiting and diarrhea.  Musculoskeletal:   Negative for focal pain or swelling All other systems reviewed and are negative except as documented above in ROS and HPI.  ____________________________________________   PHYSICAL EXAM:  VITAL SIGNS: ED Triage Vitals [10/02/17 1259]  Enc Vitals Group     BP (!) 125/54     Pulse Rate 83     Resp 19     Temp 98.6 F (  37 C)     Temp Source Oral     SpO2 96 %     Weight 160 lb (72.6 kg)     Height  (1.778 m)     Head Circumference      Peak Flow      Pain Score 0     Pain Loc      Pain Edu?      Excl. in GC?     Vital signs reviewed, nursing assessments reviewed.   Constitutional:   Alert and oriented. Well appearing and in no distress. Eyes:   Conjunctivae are normal. EOMI. PERRL. ENT      Head:   Normocephalic with mild scattered abrasions on the left cheek. Nontender. No swelling or ecchymosis or lacerations.      Nose:   No congestion/rhinnorhea. no epistaxis       Mouth/Throat:   MMM, no pharyngeal erythema. No peritonsillar mass. no intraoral injury. No tongue bite      Neck:   No meningismus. Full ROM. Hematological/Lymphatic/Immunilogical:   No cervical lymphadenopathy. Cardiovascular:   RRR. Symmetric bilateral radial and DP pulses.  No murmurs.  Respiratory:   Normal respiratory effort without tachypnea/retractions. Breath sounds are clear and equal bilaterally. No wheezes/rales/rhonchi. Gastrointestinal:   Soft and nontender. Non distended. There is no CVA tenderness.  No rebound, rigidity, or guarding. Genitourinary:   deferred Musculoskeletal:   Normal range of motion in all extremities. No joint effusions.  No lower extremity tenderness.  No edema. Neurologic:   Normal speech and language.  Motor grossly intact. No acute focal neurologic deficits are appreciated.  Skin:    Skin is warm, dry and intact. No rash noted.  No petechiae, purpura, or bullae.  ____________________________________________    LABS (pertinent positives/negatives) (all labs ordered are listed, but only abnormal results are displayed) Labs Reviewed - No data to display ____________________________________________   EKG  interpreted by me sinus rhythm rate of 89, rightward axis. Normal intervals. Normal QRS ST segments and T waves.  ____________________________________________    RADIOLOGY  No results found.  ____________________________________________   PROCEDURES Procedures  ____________________________________________    CLINICAL IMPRESSION / ASSESSMENT AND PLAN / ED COURSE  Pertinent labs & imaging results that were available during my care of the patient were reviewed by me and considered in my medical decision making (see chart for details).    patient well-appearing no acute distress, at baseline, normal vital signs, no symptoms. Apparently had a seizure today. Reportedly he is compliant with his antiepileptic regimen of 2 Lamictal to 25  twice a day. Discussed with Dr. Thad Ranger for neurology, recommends increasing to 250 twice a day and following up with his outpatient neurologist. I don't think that checking any labs or getting neuro imaging today will be helpful since this is a chronic recurrent problem.  I doubt stroke intracranial hemorrhage meningitis encephalitis or metabolic derangement such as hyponatremia.     ____________________________________________   FINAL CLINICAL IMPRESSION(S) / ED DIAGNOSES    Final diagnoses:  Seizure [facility])     ED Discharge Orders    None      Portions of this note were generated with dragon dictation software. Dictation errors may occur despite best attempts at proofreading.    Sharman Cheek, MD 10/02/17 1321

## 2017-10-02 NOTE — ED Provider Notes (Signed)
[facility] Emergency Department Provider Note ____________________________________________   First MD Initiated Contact with Patient 10/02/17 1715     (approximate)  I have reviewed the triage vital signs and the nursing notes.   HISTORY  Chief Complaint Seizures    HPI Michael Pope is a 22 y.o. male with PMH as noted below who presents from his group home after a seizure, acute onset, now resolved, described as generalized, and similar to prior seizures.  The patient had a seizure earlier this morning and was seen in the ER.  At that time he had returned to his baseline, and was discharged home.  He did not have any work-up, however neurology was consulted and recommended increasing his Lamictal dose.  Patient states that he had not yet taken the higher dose.  At this time, the patient states that he feels back to normal, and denies any current complaints.  He states that his most recent seizure before today was at least several months ago.   Past Medical History:  Diagnosis Date  . Conduct disorder   . Disruptive mood dysregulation disorder (HCC)   . Mild intellectual disability   . PTSD (post-traumatic stress disorder)   . Seizures [facility])     Patient Active Problem List   Diagnosis Date Noted  . Seizures (HCC) 07/30/2017  . Elevated LFTs 12/31/2016  . Nonintractable generalized idiopathic epilepsy without status epilepticus (HCC) 10/15/2016  . Seizure (HCC) 09/08/2016  . 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 Allergy to peanuts 04/29/2013    History reviewed. No pertinent surgical history.  Prior to Admission medications   Medication Sig Start Date End Date Taking? Authorizing Provider  baclofen (LIORESAL) 10 MG tablet Take 1 tablet (10 mg total) by mouth 3 (three) times daily. 04/17/16   Beers, Charmayne Sheer, PA-C  EPINEPHrine 0.3 mg/0.3 mL IJ SOAJ injection Inject into the muscle.    [provider]  FLUoxetine (PROZAC) 20 MG capsule Take 1 capsule (20 mg total) by mouth daily. 05/12/16   Irean Hong, MD  lamoTRIgine (LAMICTAL) 200 MG tablet Take 1 tablet (200 mg total) by mouth 2 (two) times daily. 07/31/17 07/31/18  Altamese Dilling, MD  lamoTRIgine (LAMICTAL) 25 MG tablet Take 2 tablets (50 mg total) by mouth 2 (two) times daily for 15 days. Along with 200 mg , as a total of 225 mg oral BID 10/02/17 10/17/17  Sharman Cheek, MD  prazosin (MINIPRESS) 1 MG capsule Take by mouth.    [provider]  QUEtiapine (SEROQUEL) 100 MG tablet Take by mouth.    [provider]    Allergies Almond oil; Fish allergy; Other; Peanuts [peanut oil]; and Pollen extract  Family History  Problem Relation Age of Onset  . CAD Father     Social History Social History   Tobacco Use  . Smoking status: Never Smoker  . Smokeless tobacco: Never Used  Substance Use Topics  . Alcohol use: No  . Drug use: No    Review of Systems  Constitutional: No fever. Eyes: No visual changes. ENT: No neck pain. Cardiovascular: Denies chest pain. Respiratory: Denies shortness of breath. Gastrointestinal: No vomiting. Genitourinary: Negative for flank pain.  Musculoskeletal: Negative for back pain. Skin: Negative for rash. Neurological: Negative for headache.   ____________________________________________   PHYSICAL EXAM:  VITAL SIGNS: ED Triage Vitals  Enc Vitals Group     BP 10/02/17 1706 107/85     Pulse Rate  10/02/17 1706 86     Resp 10/02/17 1706 16     Temp 10/02/17 1706 98.8 F (37.1 C)     Temp Source 10/02/17 1706 Oral     SpO2 10/02/17 1706 97 %     Weight 10/02/17 1707 161 lb (73 kg)     Height 10/02/17 1707  (1.778 m)     Head Circumference --      Peak Flow --      Pain Score 10/02/17 1707 0     Pain Loc --      Pain Edu? --      Excl. in GC? --     Constitutional: Alert and oriented. Well appearing and in no acute distress. Eyes:  Conjunctivae are normal.  EOMI.  PERRLA. Head: Atraumatic. Nose: No congestion/rhinnorhea. Mouth/Throat: Mucous membranes are moist.   Neck: Normal range of motion.  No C-spine tenderness. Cardiovascular:  Good peripheral circulation. Respiratory: Normal respiratory effort.  Gastrointestinal: No distention.  Musculoskeletal: No lower extremity edema.  Extremities warm and well perfused.  Neurologic:  Normal speech and language.  Motor and sensory intact in all extremities.  Cranial nerves III through XII intact.  Normal coordination with no ataxia on finger-to-nose.  No gross focal neurologic deficits are appreciated.  Skin:  Skin is warm and dry. No rash noted. Psychiatric: Mood and affect are normal. Speech and behavior are normal.  ____________________________________________   LABS (all labs ordered are listed, but only abnormal results are displayed)  Labs Reviewed  COMPREHENSIVE METABOLIC PANEL - Abnormal; Notable for the following components:      Result Value   Glucose, Bld 117 (*)    BUN <5 (*)    All other components within normal limits  CBC WITH DIFFERENTIAL/PLATELET - Abnormal; Notable for the following components:   RBC 3.99 (*)    Hemoglobin 12.4 (*)    HCT 36.5 (*)    All other components within normal limits   ____________________________________________  EKG  ED ECG REPORT I, Dionne Bucy, the attending physician, personally viewed and interpreted this ECG.  Date: 10/02/2017 EKG Time: 1739 Rate: 72 Rhythm: normal sinus rhythm QRS Axis: normal Intervals: normal ST/T Wave abnormalities: normal Narrative Interpretation: no evidence of acute ischemia  ____________________________________________  RADIOLOGY    ____________________________________________   PROCEDURES  Procedure(s) performed: No  Procedures  Critical Care performed: No ____________________________________________   INITIAL IMPRESSION / ASSESSMENT AND PLAN / ED  COURSE  Pertinent labs & imaging results that were available during my care of the patient were reviewed by me and considered in my medical decision making (see chart for details).  22 year old male with history of seizure disorder presents after an apparent second seizure today.  He was seen in the ED earlier today after a seizure, and no work-up was obtained at that time.  Neurology was consulted and recommended increasing his Lamictal dose.  Patient had not yet taken the higher dosage.  On exam, the patient is comfortable appearing.  Vital signs are normal.  Neuro exam is nonfocal.  Patient is without complaint currently.  Patient states he has been compliant with his medications.  At this time, since patient did not have a work-up earlier, will obtain basic labs to rule out a precipitating etiology such as electrolyte abnormality.  There is no indication for neuroimaging at this time.  We will administer the patient's evening dose of the new higher dosage of Lamictal that was prescribed today.  Given that the patient return to  his baseline both between the seizures, and again now, and otherwise has nonfocal neurologic exam and is compliant with medications, there is no indication for admission.  We will observe the patient for 1 to 2 hours, and if no recurrent seizure activity and negative work-up, anticipate discharge home with plan for neurology outpatient follow-up.  ----------------------------------------- 8:09 PM on 10/02/2017 -----------------------------------------  Lab work-up is unremarkable.  The patient is remained stable in the ED with no further seizures.  He is stable for discharge home at this time.  We will contact the group home.  He agrees to follow-up with his neurologist.  Return precautions given, and he expresses understanding.  These instructions will also be given to the group home representative.   ____________________________________________   FINAL CLINICAL  IMPRESSION(S) / ED DIAGNOSES  Final diagnoses:  Seizure disorder (HCC)      NEW MEDICATIONS STARTED DURING THIS VISIT:  New Prescriptions   No medications on file     Note:  This document was prepared using Dragon voice recognition software and may include unintentional dictation errors.    Dionne Bucy, MD 10/02/17 2010

## 2017-10-02 NOTE — ED Notes (Addendum)
Called [name](group home) to notify that pt was in the ED voicebox full and unable to leave message at this time [phone]  Jyl Heinz (legal guardian) called at [phone] - left HIPPA compliant message to return call to the ED  Melissa (Peer Support) called at [phone] and left voicemail to notify her that pt is in the ED  Pt comes from [facility]

## 2017-10-02 NOTE — ED Notes (Signed)
Attempted to call pt legal guardian Chavis at [phone] to inform him that pt is in ED. Did not answer phone call and voicemail box was full so unable to leave a message at this time.

## 2017-10-02 NOTE — ED Notes (Addendum)
Called Chavis pt's legal guardian to inform him that patient is up for discharge from hospital. Left message on voicemail.

## 2017-10-02 NOTE — ED Triage Notes (Signed)
Pt comes via [facility] from group home ([address] with c/o seizure. Pt does have hx of seizures. Per EMS pt was outside playing and was informed that he had a seizure. Pt has abrasions on left side of face, cheek and left hand. EMS vitals BP-140/96, HR110, BS-119. Pt is alert and answering questions at this time. Pt does have legal guardian.

## 2017-10-02 NOTE — Discharge Instructions (Addendum)
Increase your lamotrigine to  two times a day. This can be taken as one  tablet and two  tablets together, morning and night. Follow up with Dr. Malvin Johns in one week.

## 2017-10-02 NOTE — ED Notes (Addendum)
Bryon White (Patent examiner) picking up patient at this time for discharge

## 2017-10-02 NOTE — ED Notes (Addendum)
Michael Pope called and aware that pt is up for discharge. She stated someone will be on their way to get him

## 2017-10-02 NOTE — ED Triage Notes (Signed)
Pt was just DC earlier today for seizure. Went back to group home and had a seizure again. Pt arrives alert, oriented, ambulatory. Pt is from group home at [address]. Pt has no complaints, he just wants to go home. EMS reports that pt took his seizure meds this AM and reports that when he was DC that his seizure meds were increased. Takes lamictal. Pt is alert, oriented x 4. No distress noted. Pt states last seizure before today was a long time ago. Seizure pads in place.

## 2017-10-02 NOTE — Discharge Instructions (Addendum)
Take the higher dose of Lamictal as prescribed, starting tomorrow (the evening dose is already been provided in the ER).  Follow-up with your regular neurologist within the next several weeks.  Return to the ER for new, worsening, recurrent seizures, severe headache, weakness or lightheadedness, or any other new or worsening symptoms that concern you.

## 2017-10-05 ENCOUNTER — Encounter: Payer: Self-pay | Admitting: Emergency Medicine

## 2017-10-05 ENCOUNTER — Emergency Department
Admission: EM | Admit: 2017-10-05 | Discharge: 2017-10-05 | Disposition: A | Discharge status: Home / Self Care | Payer: Medicaid Other | Attending: Emergency Medicine | Admitting: Emergency Medicine

## 2017-10-05 ENCOUNTER — Emergency Department
Admission: EM | Admit: 2017-10-05 | Discharge: 2017-10-05 | Disposition: A | Discharge status: Home / Self Care | Payer: Medicaid Other | Source: Home / Self Care | Attending: Emergency Medicine | Admitting: Emergency Medicine

## 2017-10-05 ENCOUNTER — Other Ambulatory Visit: Payer: Self-pay

## 2017-10-05 DIAGNOSIS — Z91010 Allergy to peanuts: Secondary | ICD-10-CM | POA: Insufficient documentation

## 2017-10-05 DIAGNOSIS — R569 Unspecified convulsions: Secondary | ICD-10-CM | POA: Diagnosis not present

## 2017-10-05 DIAGNOSIS — F70 Mild intellectual disabilities: Secondary | ICD-10-CM | POA: Insufficient documentation

## 2017-10-05 DIAGNOSIS — G40909 Epilepsy, unspecified, not intractable, without status epilepticus: Secondary | ICD-10-CM

## 2017-10-05 DIAGNOSIS — Z79899 Other long term (current) drug therapy: Secondary | ICD-10-CM | POA: Insufficient documentation

## 2017-10-05 LAB — COMPREHENSIVE METABOLIC PANEL
ALT: 25 U/L (ref 17–63)
AST: 33 U/L (ref 15–41)
Albumin: 4.1 g/dL (ref 3.5–5.0)
Alkaline Phosphatase: 98 U/L (ref 38–126)
Anion gap: 6 (ref 5–15)
BUN: 7 mg/dL (ref 6–20)
CHLORIDE: 108 mmol/L (ref 101–111)
CO2: 24 mmol/L (ref 22–32)
CREATININE: 0.87 mg/dL (ref 0.61–1.24)
Calcium: 9 mg/dL (ref 8.9–10.3)
GFR calc non Af Amer: >60 mL/min (ref >60)
Glucose, Bld: 92 mg/dL (ref 65–99)
Potassium: 4.1 mmol/L (ref 3.5–5.1)
SODIUM: 138 mmol/L (ref 135–145)
Total Bilirubin: 0.6 mg/dL (ref 0.3–1.2)
Total Protein: 6.6 g/dL (ref 6.5–8.1)

## 2017-10-05 LAB — CBC WITH DIFFERENTIAL/PLATELET
Basophils Absolute: 0 10*3/uL (ref 0–0.1)
Basophils Relative: 1 %
EOS ABS: 0.1 10*3/uL (ref 0–0.7)
Eosinophils Relative: 1 %
HCT: 36.7 % — ABNORMAL LOW (ref 40.0–52.0)
HEMOGLOBIN: 12.4 g/dL — AB (ref 13.0–18.0)
LYMPHS ABS: 1 10*3/uL (ref 1.0–3.6)
LYMPHS PCT: 15 %
MCH: 30.8 pg (ref 26.0–34.0)
MCHC: 33.7 g/dL (ref 32.0–36.0)
MCV: 91.4 fL (ref 80.0–100.0)
Monocytes Absolute: 0.4 10*3/uL (ref 0.2–1.0)
Monocytes Relative: 6 %
NEUTROS PCT: 77 %
Neutro Abs: 5 10*3/uL (ref 1.4–6.5)
Platelets: 229 10*3/uL (ref 150–440)
RBC: 4.01 MIL/uL — AB (ref 4.40–5.90)
RDW: 12.8 % (ref 11.5–14.5)
WBC: 6.4 10*3/uL (ref 3.8–10.6)

## 2017-10-05 LAB — VALPROIC ACID LEVEL

## 2017-10-05 LAB — ACETAMINOPHEN LEVEL

## 2017-10-05 LAB — ETHANOL: Alcohol, Ethyl (B): <10 mg/dL (ref <10)

## 2017-10-05 LAB — MAGNESIUM: Magnesium: 1.7 mg/dL (ref 1.7–2.4)

## 2017-10-05 LAB — SALICYLATE LEVEL: Salicylate Lvl: <7 mg/dL (ref 2.8–30.0)

## 2017-10-05 MED ORDER — DIVALPROEX SODIUM 125 MG PO DR TAB: 125.0000 mg | DELAYED_RELEASE_TABLET | Freq: Two times a day (BID) | ORAL | 1 refills | Status: AC

## 2017-10-05 MED ORDER — LORAZEPAM 1 MG PO TABS: ORAL_TABLET | ORAL | 0 refills | Status: AC

## 2017-10-05 MED ORDER — LEVETIRACETAM IN NACL 1000 MG/100ML IV SOLN
1000.0000 mg | INTRAVENOUS | Status: AC
  Administered 2017-10-05: 1000 mg via INTRAVENOUS
  Filled 2017-10-05: qty 100

## 2017-10-05 MED ORDER — DIVALPROEX SODIUM 125 MG PO DR TAB
125.0000 mg | DELAYED_RELEASE_TABLET | Freq: Once | ORAL | Status: AC
  Administered 2017-10-05: 125 mg via ORAL
  Filled 2017-10-05: qty 1

## 2017-10-05 MED ORDER — LORAZEPAM 1 MG PO TABS
1.0000 mg | ORAL_TABLET | Freq: Once | ORAL | Status: AC
  Administered 2017-10-05: 1 mg via ORAL
  Filled 2017-10-05: qty 1

## 2017-10-05 MED ORDER — LEVETIRACETAM 500 MG PO TABS: 500.0000 mg | ORAL_TABLET | Freq: Two times a day (BID) | ORAL | 2 refills | Status: AC

## 2017-10-05 NOTE — ED Triage Notes (Signed)
Pt comes via [facility] with c/o seizure at [facility]. Pt is alert and states he feels sleepy. Per EMS pt was just seen here for same earlier today. EMS reports witnessed seizure by other at home that lasted about 2-3 minutes. Pt is answering questions at this time. MD at bedside

## 2017-10-05 NOTE — ED Notes (Signed)
Spoke with Michael Pope, pt legal guardian from group home and notified of pt status and his d/c home. Mr. Michael Pope reports he is on the way to come pick pt up. Pt aware and is ready to go home. VSS.

## 2017-10-05 NOTE — Discharge Instructions (Addendum)
Continue lamictal  twice a day and start taking depakote 125 mg twice a day. Follow up with your neurologist in a week.

## 2017-10-05 NOTE — ED Provider Notes (Signed)
[facility] Emergency Department Provider Note  ____________________________________________  Time seen: Approximately 10:45 AM  I have reviewed the triage vital signs and the nursing notes.   HISTORY  Chief Complaint Seizures   HPI Michael Pope is a 22 y.o. male with a history of epilepsy currently on Lamictal followed by Dr. Malvin Johns who presents to the emergency room for seizure.  This is patient's third visit in this month for seizure activity.  Back in March patient was admitted and had his Lamictal increased to 225 mg twice daily.  Patient reports that he has been 100% compliant with this medication.  Today while at work he had a sudden onset of generalized tonic-clonic seizure activity which lasted 2 minutes.  Patient was found to be postictal per EMS when they arrived.  Patient is very somnolent but able to answer questions.  He denies any recent illnesses, he denies fever, chills, nausea, vomiting, diarrhea, abdominal pain.  Patient denies drug use or alcohol use.  Past Medical History:  Diagnosis Date  . Conduct disorder   . Disruptive mood dysregulation disorder (HCC)   . Mild intellectual disability   . PTSD (post-traumatic stress disorder)   . Seizures [facility])     Patient Active Problem List   Diagnosis Date Noted  . Seizures (HCC) 07/30/2017  . Elevated LFTs 12/31/2016  . Nonintractable generalized idiopathic epilepsy without status epilepticus (HCC) 10/15/2016  . Seizure (HCC) 09/08/2016  . 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 Allergy to peanuts 04/29/2013    History reviewed. No pertinent surgical history.  Prior to Admission medications   Medication Sig Start Date End Date Taking? Authorizing Provider  baclofen (LIORESAL) 10 MG tablet Take 1 tablet (10 mg total) by mouth 3 (three) times daily. 04/17/16  Yes Beers, Charmayne Sheer, PA-C  EPINEPHrine 0.3 mg/0.3 mL IJ SOAJ injection Inject 0.3 mg  into the muscle as needed (anaphylaxis/allergic reaction).    Yes [provider]  FLUoxetine (PROZAC) 20 MG capsule Take 1 capsule (20 mg total) by mouth daily. 05/12/16  Yes Irean Hong, MD  guanFACINE (TENEX) 2 MG tablet Take 2 mg by mouth 2 (two) times daily.   Yes [provider]  lamoTRIgine (LAMICTAL) 200 MG tablet Take 1 tablet (200 mg total) by mouth 2 (two) times daily. 07/31/17 07/31/18 Yes Altamese Dilling, MD  lamoTRIgine (LAMICTAL) 25 MG tablet Take 2 tablets (50 mg total) by mouth 2 (two) times daily for 15 days. Along with 200 mg , as a total of 225 mg oral BID 10/02/17 10/17/17 Yes Sharman Cheek, MD  prazosin (MINIPRESS) 2 MG capsule Take 2 mg by mouth at bedtime.    Yes [provider]  QUEtiapine (SEROQUEL) 100 MG tablet Take 100 mg by mouth 2 (two) times daily.    Yes [provider]  divalproex (DEPAKOTE) 125 MG DR tablet Take 1 tablet (125 mg total) by mouth 2 (two) times daily. 10/05/17 10/05/18  Nita Sickle, MD    Allergies Almond oil; Fish allergy; Other; Peanuts [peanut oil]; and Pollen extract  Family History  Problem Relation Age of Onset  . CAD Father     Social History Social History   Tobacco Use  . Smoking status: Never Smoker  . Smokeless tobacco: Never Used  Substance Use Topics  . Alcohol use: No  . Drug use: No    Review of Systems  Constitutional: Negative for fever. Eyes: Negative for visual changes. ENT: Negative  for sore throat. Neck: No neck pain  Cardiovascular: Negative for chest pain. Respiratory: Negative for shortness of breath. Gastrointestinal: Negative for abdominal pain, vomiting or diarrhea. Genitourinary: Negative for dysuria. Musculoskeletal: Negative for back pain. Skin: Negative for rash. Neurological: Negative for headaches, weakness or numbness. + seizure Psych: No SI or HI  ____________________________________________   PHYSICAL EXAM:  VITAL SIGNS:  Temp  97.26F 15 -- 98 % -- 51 -- 53 115/6   Constitutional: Sleepy, easily arousable, no apparent distress. HEENT:      Head: Normocephalic and atraumatic.         Eyes: Conjunctivae are normal. Sclera is non-icteric.       Mouth/Throat: Mucous membranes are moist.       Neck: Supple with no signs of meningismus. Cardiovascular: Regular rate and rhythm. No murmurs, gallops, or rubs. 2+ symmetrical distal pulses are present in all extremities. No JVD. Respiratory: Normal respiratory effort. Lungs are clear to auscultation bilaterally. No wheezes, crackles, or rhonchi.  Gastrointestinal: Soft, non tender, and non distended with positive bowel sounds. No rebound or guarding. Musculoskeletal: Nontender with normal range of motion in all extremities. No edema, cyanosis, or erythema of extremities. Neurologic: Normal speech and language. Face is symmetric. Moving all extremities. No gross focal neurologic deficits are appreciated. Skin: Skin is warm, dry and intact. No rash noted. Psychiatric: Mood and affect are normal. Speech and behavior are normal.  ____________________________________________   LABS (all labs ordered are listed, but only abnormal results are displayed)  Labs Reviewed  LAMOTRIGINE LEVEL   ____________________________________________  EKG  Pope ____________________________________________  RADIOLOGY  Pope  ____________________________________________   PROCEDURES  Procedure(s) performed: Pope Procedures Critical Care performed:  Pope ____________________________________________   INITIAL IMPRESSION / ASSESSMENT AND PLAN / ED COURSE   22 y.o. male with a history of epilepsy currently on Lamictal followed by Dr. Malvin Johns who presents to the emergency room for seizure.  This is patient's third seizure this month.  He had labs done 3 days ago which were normal.  Lamictal level was not sent.  We will send Lamictal level at this time.  Do not believe patient needs any  further lab work or imaging at this time.  I discussed with Dr. Malvin Johns who is patient's neurologist and he recommended adding Depakote 125 mg twice daily and continuing the Lamictal as it is.  He will see patient in the office within the next week.  Patient will be monitored until he is fully awake and back to his baseline.   ED COURSE: Patient was monitored in the emergency room for 4 hours.  Full return to baseline.  No further episodes of seizure.  Lamictal level is pending.  Patient was discharged back to his group home on Lamictal and Depakote and follow-up with Dr. Malvin Johns.   As part of my medical decision making, I reviewed the following data within the electronic MEDICAL RECORD NUMBER Nursing notes reviewed and incorporated, Labs reviewed , Old chart reviewed, A consult was requested and obtained from this/these consultant(s) Neurology, Notes from prior ED visits and [place] Controlled Substance Database    Pertinent labs & imaging results that were available during my care of the patient were reviewed by me and considered in my medical decision making (see chart for details).    ____________________________________________   FINAL CLINICAL IMPRESSION(S) / ED DIAGNOSES  Final diagnoses:  Seizure (HCC)      NEW MEDICATIONS STARTED DURING THIS VISIT:  ED Discharge Orders  Ordered    divalproex (DEPAKOTE) 125 MG DR tablet  2 times daily     10/05/17 1345       Note:  This document was prepared using Dragon voice recognition software and may include unintentional dictation errors.    Don Perking, [place], MD 10/05/17 [phone]

## 2017-10-05 NOTE — ED Notes (Signed)
Michael Pope called and stated he is on his way to pick up patient

## 2017-10-05 NOTE — ED Notes (Signed)
Called Chavis patients legal guardian and left voicemail to notify that pt is up for discharge.

## 2017-10-05 NOTE — Discharge Instructions (Signed)
You were evaluated again today for seizure disorder.  I had another conversation with Dr. Malvin Johns about you, and we talked about whether or not you should stay in the hospital, but he pointed out that they will not be able to do anything different for you other than work on finding a combination of medications that works for you.  In addition to the Depakote that was added earlier today, and the Lamictal you were already taking, he recommended that you start taking Keppra as listed in these papers and as prescribed.  He also recommended that you take Ativan as prescribed for the next three days (3 tablets tomorrow, 2 tablets the next day, and 1 tablet the day after that).    Please follow up with Dr. Daisy Blossom office at the next available opportunity to schedule a follow up appointment.  Return to the emergency department if you develop new or worsening symptoms that concern you.

## 2017-10-05 NOTE — ED Notes (Signed)
Called and spoke with legal guardian Gretta Began [phone] and informed him of pt's plan of care and status.

## 2017-10-05 NOTE — ED Triage Notes (Signed)
2 minutes seizure activity per coworkers.  Pt has been taking medications.  Slow to respond when EMS arrived but able to answer questions. Pt alert on arrival to ED. NAD. Here recently for same

## 2017-10-05 NOTE — ED Notes (Addendum)
Pt's caregiver Clinton Sawyer was called and left message stating that pt is up for discharge and needs to be picked up

## 2017-10-05 NOTE — ED Notes (Signed)
Spoke with Rosario Jacks [phone]) from group home and notified that pt was in ED. [name] states that she will let her boss know from the group home and he will be in shortly. Pt updated with plan of care.

## 2017-10-05 NOTE — ED Notes (Signed)
Patient denies pain and is resting comfortably on stretcher.

## 2017-10-05 NOTE — ED Notes (Signed)
Pharmacy to send up medication for pt 

## 2017-10-05 NOTE — ED Notes (Signed)
Assumed care of pt at this time. Pt resting with even/unlabored respirations on stretcher. Pt denies any pain currently. Pt in view of staff at nurse's station. Will attempt to call pt legal guardian.

## 2017-10-05 NOTE — ED Notes (Signed)
D/c instructions reviewed with Clinton Sawyer in [place] and pt wheeled out of ED in wheelchair.

## 2017-10-05 NOTE — ED Notes (Signed)
Called and spoke with Helyn Numbers (caregiver) about pt's care and plan.

## 2017-10-05 NOTE — ED Provider Notes (Signed)
[facility] Emergency Department Provider Note  ____________________________________________   First MD Initiated Contact with Patient 10/05/17 1531     (approximate)  I have reviewed the triage vital signs and the nursing notes.   HISTORY  Chief Complaint Seizures  Level 5 caveat:  history/ROS may be limited by altered mental status/confusion as well as chronic psych illness and intellectual disability.   HPI Michael Pope is a 22 y.o. male who presents by EMS for evaluation after a seizure.  Reportedly he was seen earlier this morning for seizure and was observed for more than 5 hours to the point he was no longer postictal and been able to eat a meal.  The prior ED provider spoke with his neurologist, Dr. Malvin Johns, who recommended adding Depakote in addition to the existing Lamictal that he reportedly has been taking according to instructions.  According to EMS he went back to his home at [facility] and within a couple of hours had a generalized tonic-clonic seizure witnessed by others that lasted 2 to 3 minutes.  He was again postictal (sleepy and decreased level responsiveness) when EMS arrived but he is waking up a little bit more by the time that he arrived to the ED.  He denies any pain and reports being sleepy.  He denies headache, neck pain, chest pain, shortness of breath, nausea, vomiting, and abdominal pain.  He does not have any tongue or mouth injuries after the seizure.  He denies being under an increased amount of stress recently and he denies having trouble with anybody in particular with any situation.  Reportedly the onset of symptoms was acute and severe but relatively brief and resolved on its own.  Past Medical History:  Diagnosis Date  . Conduct disorder   . Disruptive mood dysregulation disorder (HCC)   . Mild intellectual disability   . PTSD (post-traumatic stress disorder)   . Seizures [facility])     Patient Active Problem List    Diagnosis Date Noted  . Seizures (HCC) 07/30/2017  . Elevated LFTs 12/31/2016  . Nonintractable generalized idiopathic epilepsy without status epilepticus (HCC) 10/15/2016  . Seizure (HCC) 09/08/2016  . 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 Allergy to peanuts 04/29/2013    History reviewed. No pertinent surgical history.  Prior to Admission medications   Medication Sig Start Date End Date Taking? Authorizing Provider  baclofen (LIORESAL) 10 MG tablet Take 1 tablet (10 mg total) by mouth 3 (three) times daily. 04/17/16   Beers, Charmayne Sheer, PA-C  divalproex (DEPAKOTE) 125 MG DR tablet Take 1 tablet (125 mg total) by mouth 2 (two) times daily. 10/05/17 10/05/18  Nita Sickle, MD  EPINEPHrine 0.3 mg/0.3 mL IJ SOAJ injection Inject 0.3 mg into the muscle as needed (anaphylaxis/allergic reaction).     [provider]  FLUoxetine (PROZAC) 20 MG capsule Take 1 capsule (20 mg total) by mouth daily. 05/12/16   Irean Hong, MD  guanFACINE (TENEX) 2 MG tablet Take 2 mg by mouth 2 (two) times daily.    [provider]  lamoTRIgine (LAMICTAL) 200 MG tablet Take 1 tablet (200 mg total) by mouth 2 (two) times daily. 07/31/17 07/31/18  Altamese Dilling, MD  lamoTRIgine (LAMICTAL) 25 MG tablet Take 2 tablets (50 mg total) by mouth 2 (two) times daily for 15 days. Along with 200 mg , as a total of 225 mg oral BID 10/02/17 10/17/17  Sharman Cheek, MD  levETIRAcetam (KEPPRA) 500  MG tablet Take 1 tablet (500 mg total) by mouth 2 (two) times daily. 10/05/17   Loleta Rose, MD  LORazepam (ATIVAN) 1 MG tablet Take 1 tablet by mouth 3 times a day on 5/23, then take 1 tablet by mouth 2 times a day on 5/24, then take 1 tablet by mouth once on 5/25. 10/06/17 10/08/17  Loleta Rose, MD  prazosin (MINIPRESS) 2 MG capsule Take 2 mg by mouth at bedtime.     [provider]  QUEtiapine (SEROQUEL) 100 MG tablet Take 100 mg by mouth 2 (two) times  daily.     [provider]    Allergies Almond oil; Fish allergy; Other; Peanuts [peanut oil]; and Pollen extract  Family History  Problem Relation Age of Onset  . CAD Father     Social History Social History   Tobacco Use  . Smoking status: Never Smoker  . Smokeless tobacco: Never Used  Substance Use Topics  . Alcohol use: No  . Drug use: No    Review of Systems Level 5 caveat:  history/ROS may be limited by altered mental status/confusion as well as chronic psych illness and intellectual disability.  Constitutional: No fever/chills Eyes: No visual changes. ENT: No sore throat. Cardiovascular: Denies chest pain. Respiratory: Denies shortness of breath. Gastrointestinal: No abdominal pain.  No nausea, no vomiting.  No diarrhea.  No constipation. Genitourinary: Negative for dysuria. Musculoskeletal: Negative for neck pain.  Negative for back pain. Integumentary: Negative for rash. Neurological: Reported 2-3 minute generalized tonic-clonic seizure. Currently negative for headaches, focal weakness or numbness. Psychiatric:Quiet, confused, feels sleepy, no concerning psychiatric red flags  ____________________________________________   PHYSICAL EXAM:  VITAL SIGNS: ED Triage Vitals  Enc Vitals Group     BP 10/05/17 1530 135/76     Pulse Rate 10/05/17 1530 86     Resp --      Temp 10/05/17 1530 98.7 F (37.1 C)     Temp Source 10/05/17 1530 Oral     SpO2 10/05/17 1530 99 %     Weight 10/05/17 1534 73 kg (161 lb)     Height 10/05/17 1534 1.778 m ( )     Head Circumference --      Peak Flow --      Pain Score 10/05/17 1533 0     Pain Loc --      Pain Edu? --      Excl. in GC? --     Constitutional: Alert and oriented but slow to respond.  No acute distress Eyes: Conjunctivae are normal. PERRL. EOMI. Head: Atraumatic. Nose: No congestion/rhinnorhea. Mouth/Throat: Mucous membranes are moist. Neck: No stridor.  No meningeal signs.     Cardiovascular: Normal rate, regular rhythm. Good peripheral circulation. Grossly normal heart sounds. Respiratory: Normal respiratory effort.  No retractions. Lungs CTAB. Gastrointestinal: Soft and nontender. No distention.  Musculoskeletal: No lower extremity tenderness nor edema. No gross deformities of extremities. Neurologic:  Normal speech and language. No gross focal neurologic deficits are appreciated.  Skin:  Skin is warm, dry and intact. No rash noted. Psychiatric: Mood and affect are normal. Speech and behavior are normal.  ____________________________________________   LABS (all labs ordered are listed, but only abnormal results are displayed)  Labs Reviewed  CBC WITH DIFFERENTIAL/PLATELET - Abnormal; Notable for the following components:      Result Value   RBC 4.01 (*)    Hemoglobin 12.4 (*)    HCT 36.7 (*)    All other components within normal limits  ACETAMINOPHEN LEVEL - Abnormal; Notable for the following components:   Acetaminophen (Tylenol), Serum <10 (*)    All other components within normal limits  VALPROIC ACID LEVEL - Abnormal; Notable for the following components:   Valproic Acid Lvl <10 (*)    All other components within normal limits  COMPREHENSIVE METABOLIC PANEL  MAGNESIUM  SALICYLATE LEVEL  ETHANOL  URINALYSIS, ROUTINE W REFLEX MICROSCOPIC  URINE DRUG SCREEN, QUALITATIVE ([facility] ONLY)   ____________________________________________  EKG  No indication for EKG ____________________________________________  RADIOLOGY   ED MD interpretation:  No indication for imaging  Official radiology report(s): No results found.  ____________________________________________   PROCEDURES  Critical Care performed: No   Procedure(s) performed:   Procedures   ____________________________________________   INITIAL IMPRESSION / ASSESSMENT AND PLAN / ED COURSE  As part of my medical decision making, I reviewed the following data within the  electronic MEDICAL RECORD NUMBER Nursing notes reviewed and incorporated, Labs reviewed  and A phone consult was requested and obtained from this/these consultant(s) Neurology (Dr. Malvin Johns)  Differential diagnosis includes, but is not limited to, persistent and recurrent seizure disorder, pseudoseizures/nonepileptic seizure, intoxication, drug use, medication side effect, metabolic/electrolyte abnormality, intracranial injury such as a head bleed, CVA.  The patient is currently in no acute distress, calm, interactive, but does appear postictal.  No evidence of any injury after his episode.  Given that this is his second witnessed seizure today and he was just discharged after about 5 hours observation from the ED within the last 2 hours, I will again call Dr. Malvin Johns and request his recommendation, whether he can follow-up as an outpatient, but he needs to be admitted here, or whether he should be transferred to [facility], where they have continuous EEG monitoring.  If Dr. Malvin Johns is unavailable by phone, I will contact Dr. Thad Ranger and ask for advice and opinion as well.  I will work-up the patient this time in terms of lab work.  No imaging indicated currently.   Clinical Course as of Oct 05 2032  Wed Oct 05, 2017  1557 I spoke by phone with Dr. Malvin Johns to discuss the case.  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  I specifically ask if he feels that the patient would benefit from admission to this hospital or transferred to [facility] where they can do continuous EEG monitoring.  He truly does not feel that this would be beneficial to the patient because he has been worked up extensively in the past and he feels that it is a matter of just finding the right combination of medications.Given the recurrent seizures, he recommended observing the patient after a full seizure/medical work-up now and then discharging with the  following: 1)  give Keppra 1 g IV now, then discharge on Keppra 500 mg p.o. twice daily  2) Ativan 1 mg by mouth now and again if he is still here this evening or prior to discharge.  Write prescription for Ativan 1 mg p.o. 3 times daily tomorrow, Ativan 1 mg p.o. twice daily the next day, and Ativan 1 mg once daily the following day.  3) maintain the current dosages of lamotrigine and Depakote  Assuming no abnormalities are surprising the patient's medical work-up, once he is back to baseline, able to tolerate p.o., and has received the medications as recommended, he should be appropriate for discharge and outpatient follow-up.  I will make  sure to indicate in his paperwork what are the medication recommendations and changes and he can follow-up with Dr. Potter in clinic.   [CF]  173Malvin Johnsalproic Acid,S(!): <10 [CF]  1738 Magnesium: 1.7 [CF]  1738 Alcohol, Ethyl (B): <10 [CF]  1738 WNL  Comprehensive metabolic panel [CF]  1739 WNL  CBC WITH DIFFERENTIAL(!) [CF]  1739 I checked on the patient twice, stable, sleeping comfortably.   [CF]  1741 Acetaminophen (Tylenol), S(!): <10 [CF]  1741 Salicylate Lvl: <7.0 [CF]  1829 Patient remained stable and sleeping, no additional symptoms, no seizure-like activity.  I will discharge as per the plan previously described with his new prescriptions and the encouragement to continue taking his medications as previously prescribed.  I gave my usual and customary return precautions.    [CF]  2033 Nurse Lelon Mast) spoke with legal guardian.  Patient stable and ready for discharge.  No seizure-like activity during 5+ hours in the ED.   [CF]    Clinical Course User Index [CF] Loleta Rose, MD    ____________________________________________  FINAL CLINICAL IMPRESSION(S) / ED DIAGNOSES  Final diagnoses:  Seizure disorder [facility])     MEDICATIONS GIVEN DURING THIS VISIT:  Medications  LORazepam (ATIVAN) tablet 1 mg (1 mg Oral Given 10/05/17 1636)    levETIRAcetam (KEPPRA) IVPB 1000 mg/100 mL premix (0 mg Intravenous Stopped 10/05/17 1651)  divalproex (DEPAKOTE) DR tablet 125 mg (125 mg Oral Given 10/05/17 1828)     ED Discharge Orders        Ordered    LORazepam (ATIVAN) 1 MG tablet     10/05/17 1642    levETIRAcetam (KEPPRA) 500 MG tablet  2 times daily     10/05/17 1642       Note:  This document was prepared using Dragon voice recognition software and may include unintentional dictation errors.    Loleta Rose, MD 10/05/17 2034

## 2017-10-06 LAB — LAMOTRIGINE LEVEL: LAMOTRIGINE LVL: 2.1 ug/mL (ref 2.0–20.0)

## 2017-11-07 ENCOUNTER — Encounter: Payer: Self-pay | Admitting: Emergency Medicine

## 2017-11-07 ENCOUNTER — Emergency Department
Admission: EM | Admit: 2017-11-07 | Discharge: 2017-11-07 | Disposition: A | Discharge status: Home / Self Care | Payer: Medicaid Other | Attending: Emergency Medicine | Admitting: Emergency Medicine

## 2017-11-07 ENCOUNTER — Other Ambulatory Visit: Payer: Self-pay

## 2017-11-07 DIAGNOSIS — R569 Unspecified convulsions: Secondary | ICD-10-CM | POA: Diagnosis present

## 2017-11-07 DIAGNOSIS — F70 Mild intellectual disabilities: Secondary | ICD-10-CM | POA: Insufficient documentation

## 2017-11-07 DIAGNOSIS — Z91010 Allergy to peanuts: Secondary | ICD-10-CM | POA: Diagnosis not present

## 2017-11-07 DIAGNOSIS — F919 Conduct disorder, unspecified: Secondary | ICD-10-CM | POA: Diagnosis not present

## 2017-11-07 LAB — CBC
HCT: 41.4 % (ref 40.0–52.0)
Hemoglobin: 13.9 g/dL (ref 13.0–18.0)
MCH: 31.2 pg (ref 26.0–34.0)
MCHC: 33.6 g/dL (ref 32.0–36.0)
MCV: 92.9 fL (ref 80.0–100.0)
PLATELETS: 175 10*3/uL (ref 150–440)
RBC: 4.46 MIL/uL (ref 4.40–5.90)
RDW: 13.3 % (ref 11.5–14.5)
WBC: 5 10*3/uL (ref 3.8–10.6)

## 2017-11-07 LAB — BASIC METABOLIC PANEL
Anion gap: 16 — ABNORMAL HIGH (ref 5–15)
BUN: 6 mg/dL (ref 6–20)
CHLORIDE: 103 mmol/L (ref 101–111)
CO2: 19 mmol/L — ABNORMAL LOW (ref 22–32)
CREATININE: 1.04 mg/dL (ref 0.61–1.24)
Calcium: 9.2 mg/dL (ref 8.9–10.3)
Glucose, Bld: 99 mg/dL (ref 65–99)
POTASSIUM: 4.1 mmol/L (ref 3.5–5.1)
SODIUM: 138 mmol/L (ref 135–145)

## 2017-11-07 NOTE — ED Provider Notes (Signed)
[facility] Emergency Department Provider Note       Time seen: ----------------------------------------- 1:50 PM on 11/07/2017 -----------------------------------------   I have reviewed the triage vital signs and the nursing notes.  HISTORY   Chief Complaint Seizures    HPI Michael Pope is a 22 y.o. male with a history of neck disorder, mild intellectual disability, PTSD, seizures who presents to the ED for seizures this morning.  Patient arrives complaining of seizure this morning.  Patient states he has a history of seizures, just typically takes Lamictal and Depakote.  He denies recent illness, denies alcohol or drug use.  Past Medical History:  Diagnosis Date  . Conduct disorder   . Disruptive mood dysregulation disorder (HCC)   . Mild intellectual disability   . PTSD (post-traumatic stress disorder)   . Seizures [facility])     Patient Active Problem List   Diagnosis Date Noted  . Seizures (HCC) 07/30/2017  . Elevated LFTs 12/31/2016  . Nonintractable generalized idiopathic epilepsy without status epilepticus (HCC) 10/15/2016  . Seizure (HCC) 09/08/2016  . 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 Allergy to peanuts 04/29/2013    History reviewed. No pertinent surgical history.  Allergies Almond oil; Fish allergy; Other; Peanuts [peanut oil]; and Pollen extract  Social History Social History   Tobacco Use  . Smoking status: Never Smoker  . Smokeless tobacco: Never Used  Substance Use Topics  . Alcohol use: No  . Drug use: No   Review of Systems Constitutional: Negative for fever. Cardiovascular: Negative for chest pain. Respiratory: Negative for shortness of breath. Gastrointestinal: Negative for abdominal pain, vomiting and diarrhea. Genitourinary: Negative for dysuria. Musculoskeletal: Negative for back pain. Skin: Negative for rash. Neurological: Negative for headaches, focal  weakness or numbness.  Positive for seizures  All systems negative/normal/unremarkable except as stated in the HPI  ____________________________________________   PHYSICAL EXAM:  VITAL SIGNS: ED Triage Vitals [11/07/17 1204]  Enc Vitals Group     BP 118/70     Pulse Rate (!) 107     Resp 16     Temp 98.7 F (37.1 C)     Temp Source Oral     SpO2 95 %     Weight 160 lb (72.6 kg)     Height 5\' 8"  (1.727 m)     Head Circumference      Peak Flow      Pain Score 0     Pain Loc      Pain Edu?      Excl. in GC?    Constitutional: Alert and oriented. Well appearing and in no distress. Eyes: Conjunctivae are normal. Normal extraocular movements. Cardiovascular: Normal rate, regular rhythm. No murmurs, rubs, or gallops. Respiratory: Normal respiratory effort without tachypnea nor retractions. Breath sounds are clear and equal bilaterally. No wheezes/rales/rhonchi. Gastrointestinal: Soft and nontender. Normal bowel sounds Musculoskeletal: Nontender with normal range of motion in extremities. No lower extremity tenderness nor edema. Neurologic:  Normal speech and language. No gross focal neurologic deficits are appreciated.  Skin:  Skin is warm, dry and intact. No rash noted. Psychiatric: Mood and affect are normal. Speech and behavior are normal.  ____________________________________________  ED COURSE:  As part of my medical decision making, I reviewed the following data within the electronic MEDICAL RECORD NUMBER History obtained from family if available, nursing notes, old chart and ekg, as well as notes from prior ED visits. Patient presented for seizures, we will assess with  labs as indicated at this time.   Procedures ____________________________________________   LABS (pertinent positives/negatives)  Labs Reviewed  BASIC METABOLIC PANEL - Abnormal; Notable for the following components:      Result Value   CO2 19 (*)    Anion gap 16 (*)    All other components within  normal limits  CBC  CBG MONITORING, ED   ____________________________________________  DIFFERENTIAL DIAGNOSIS   Seizure, medication noncompliance  FINAL ASSESSMENT AND PLAN  Seizure   Plan: The patient had presented for seizure-like activity. Patient's labs were unremarkable.  We will provide refills for his seizure medication should he need them.Ulice Dash.    Johnathan E Merrianne Mccumbers, MD   Note: This note was generated in part or whole with voice recognition software. Voice recognition is usually quite accurate but there are transcription errors that can and very often do occur. I apologize for any typographical errors that were not detected and corrected.     Emily FilbertWilliams, Diera Wirkkala E, MD 11/07/17 1425

## 2017-11-07 NOTE — ED Triage Notes (Signed)
ARrives with C/O seizure this am.  Patient is AAOx3.  Skin warm and dry.  States he has a history of seizure.  Patient takes Lamictal for seizures and has been taking medications.  States prior to today, last seizure was a while ago.

## 2017-11-07 NOTE — ED Notes (Signed)
Michael SawyerByron Pope from patients group home contacted to let know patient is ready for discharge.

## 2017-11-07 NOTE — ED Notes (Signed)
Individual from group home took custody of pt in waiting room. Pt ambulatory to discharge.

## 2017-11-07 NOTE — ED Notes (Signed)
Pt discharged by Delaney MeigsJoni, RN

## 2018-03-13 ENCOUNTER — Ambulatory Visit: Payer: Medicaid Other | Admitting: Gastroenterology

## 2018-04-25 ENCOUNTER — Encounter: Payer: Self-pay | Admitting: Gastroenterology

## 2018-04-25 ENCOUNTER — Ambulatory Visit: Payer: Medicaid Other | Admitting: Gastroenterology

## 2018-04-25 ENCOUNTER — Other Ambulatory Visit: Payer: Self-pay

## 2018-04-25 VITALS — BP 128/65 | HR 78 | Resp 17 | Ht 70.0 in | Wt 157.6 lb

## 2018-04-25 DIAGNOSIS — K625 Hemorrhage of anus and rectum: Secondary | ICD-10-CM

## 2018-04-25 NOTE — Progress Notes (Signed)
Michael Repressohini R Geary Rufo, MD [address]  [address]  [place], [place] [address]  Main: [phone]  Fax: [phone]    Gastroenterology Consultation  Referring Provider:     Sherrie MustacheJadali, Fayegh, MD Primary Care Physician:  Sherrie MustacheJadali, Fayegh, MD Primary Gastroenterologist:  Dr. Arlyss Repressohini R Genesys Coggeshall Reason for Consultation: Rectal bleeding        HPI:   Michael Pope is a 22 y.o. y/o male referred for consultation & management Of elevated LFTs. He comes from a group home with an escort not answering questions properly. Upon my chart review, he is found to have mildly elevated AST to 44 on 05/11/2016 and AP elevated to 156 on 10/29/2016. His liver related history as below. He has history of seizures and currently on antiepileptic medications.  Follow-up visit 04/25/2018 Patient has not seen any in 1 year.  He had work-up of elevated LFTs and work-up came back unremarkable.  Repeat LFTs were normal.  He had episode of painless rectal bleeding, on wiping only about 2 weeks ago.  It lasted for couple of days.  He denies constipation or diarrhea.  Patient lives in a group home and brought to the clinic by his escort.  His weight has been stable.  He denies any other complaints today.  Smokes vape He denies taking opioids although it's on his medication list Not working, finished high school, looking into job He is at group home due to some issues at home   Past Medical History:  Diagnosis Date  . Conduct disorder   . Disruptive mood dysregulation disorder (HCC)   . Mild intellectual disability   . PTSD (post-traumatic stress disorder)   . Seizures (HCC)     No past surgical history on file.  Current Outpatient Medications:  .  baclofen (LIORESAL) 10 MG tablet, Take 1 tablet (10 mg total) by mouth 3 (three) times daily., Disp: 30 tablet, Rfl: 0 .  divalproex (DEPAKOTE) 125 MG DR tablet, Take 1 tablet (125 mg total) by mouth 2 (two) times daily., Disp: 60 tablet, Rfl: 1 .  EPINEPHrine 0.3  mg/0.3 mL IJ SOAJ injection, Inject 0.3 mg into the muscle as needed (anaphylaxis/allergic reaction). , Disp: , Rfl:  .  FLUoxetine (PROZAC) 20 MG capsule, Take 1 capsule (20 mg total) by mouth daily., Disp: 30 capsule, Rfl: 0 .  guanFACINE (TENEX) 2 MG tablet, Take 2 mg by mouth 2 (two) times daily., Disp: , Rfl:  .  lamoTRIgine (LAMICTAL) 200 MG tablet, Take 1 tablet (200 mg total) by mouth 2 (two) times daily., Disp: 60 tablet, Rfl: 2 .  levETIRAcetam (KEPPRA) 500 MG tablet, Take 1 tablet (500 mg total) by mouth 2 (two) times daily., Disp: 60 tablet, Rfl: 2 .  prazosin (MINIPRESS) 2 MG capsule, Take 2 mg by mouth at bedtime. , Disp: , Rfl:  .  QUEtiapine (SEROQUEL) 100 MG tablet, Take 100 mg by mouth 2 (two) times daily. , Disp: , Rfl:  .  lamoTRIgine (LAMICTAL) 25 MG tablet, Take 2 tablets (50 mg total) by mouth 2 (two) times daily for 15 days. Along with 200 mg , as a total of 225 mg oral BID, Disp: 60 tablet, Rfl: 0    Family History  Problem Relation Age of Onset  . CAD Father      Social History   Tobacco Use  . Smoking status: Never Smoker  . Smokeless tobacco: Never Used  Substance Use Topics  . Alcohol use: No  . Drug use: No  Allergies as of 04/25/2018 - Review Complete 04/25/2018  Allergen Reaction Noted  . Peanut-containing drug products Swelling 04/28/2013  . Almond oil  01/02/2015  . Fish allergy  01/02/2015  . Other  09/08/2016  . Peanuts [peanut oil]  01/02/2015  . Pollen extract  01/02/2015    Review of Systems:    All systems reviewed and negative except where noted in HPI.   Physical Exam:  BP 128/65 (BP Location: Left Arm, Patient Position: Sitting, Cuff Size: Normal)   Pulse 78   Resp 17   Ht 5\' 10"  (1.778 m)   Wt 157 lb 9.6 oz (71.5 kg)   BMI 22.61 kg/m  No LMP for male patient.  General:   Alert,  Well-developed, well-nourished, pleasant and cooperative in NAD Head:  Normocephalic and atraumatic. Eyes:  Sclera clear, no icterus.    Conjunctiva pink. Ears:  Normal auditory acuity. Nose:  No deformity, discharge, or lesions. Mouth:  No deformity or lesions,oropharynx pink & moist. Neck:  Supple; no masses or thyromegaly. Lungs:  Respirations even and unlabored.  Clear throughout to auscultation.   No wheezes, crackles, or rhonchi. No acute distress. Heart:  Regular rate and rhythm; no murmurs, clicks, rubs, or gallops. Abdomen:  Normal bowel sounds.  No bruits.  Soft, non-tender and non-distended without masses, hepatosplenomegaly or hernias noted.  No guarding or rebound tenderness.    Msk:  Symmetrical without gross deformities. Good, equal movement & strength bilaterally. Pulses:  Normal pulses noted. Extremities:  No clubbing or edema.  No cyanosis. Neurologic:  Alert and oriented x3;  grossly normal neurologically. Skin:  Intact without significant lesions or rashes. No jaundice. Psych:  Alert and cooperative. Normal mood and affect.  Imaging Studies: No results found.  Assessment and Plan:   [name]  22 y.o. male with history of painless rectal bleeding.  This was self-limited episode but lasted for few days.  Recommend flexible sigmoidoscopy   Follow up based on sigmoidoscopy results   Michael Repress, MD

## 2018-05-25 ENCOUNTER — Encounter: Payer: Self-pay | Admitting: *Deleted

## 2018-05-25 ENCOUNTER — Ambulatory Visit: Payer: Medicaid Other | Admitting: Certified Registered Nurse Anesthetist

## 2018-05-25 ENCOUNTER — Encounter
Admission: RE | Disposition: A | Discharge status: Home / Self Care | Payer: Self-pay | Source: Home / Self Care | Attending: Gastroenterology

## 2018-05-25 ENCOUNTER — Ambulatory Visit
Admission: RE | Admit: 2018-05-25 | Discharge: 2018-05-25 | Disposition: A | Discharge status: Home / Self Care | Payer: Medicaid Other | Attending: Gastroenterology | Admitting: Gastroenterology

## 2018-05-25 DIAGNOSIS — F919 Conduct disorder, unspecified: Secondary | ICD-10-CM | POA: Diagnosis not present

## 2018-05-25 DIAGNOSIS — K625 Hemorrhage of anus and rectum: Secondary | ICD-10-CM | POA: Diagnosis not present

## 2018-05-25 DIAGNOSIS — Z8249 Family history of ischemic heart disease and other diseases of the circulatory system: Secondary | ICD-10-CM | POA: Insufficient documentation

## 2018-05-25 DIAGNOSIS — Z91013 Allergy to seafood: Secondary | ICD-10-CM | POA: Insufficient documentation

## 2018-05-25 DIAGNOSIS — R569 Unspecified convulsions: Secondary | ICD-10-CM | POA: Diagnosis not present

## 2018-05-25 DIAGNOSIS — F3481 Disruptive mood dysregulation disorder: Secondary | ICD-10-CM | POA: Insufficient documentation

## 2018-05-25 DIAGNOSIS — F419 Anxiety disorder, unspecified: Secondary | ICD-10-CM | POA: Diagnosis not present

## 2018-05-25 DIAGNOSIS — Z888 Allergy status to other drugs, medicaments and biological substances status: Secondary | ICD-10-CM | POA: Insufficient documentation

## 2018-05-25 DIAGNOSIS — Z91018 Allergy to other foods: Secondary | ICD-10-CM | POA: Diagnosis not present

## 2018-05-25 DIAGNOSIS — F4310 Post-traumatic stress disorder, unspecified: Secondary | ICD-10-CM | POA: Diagnosis not present

## 2018-05-25 DIAGNOSIS — F17290 Nicotine dependence, other tobacco product, uncomplicated: Secondary | ICD-10-CM | POA: Diagnosis not present

## 2018-05-25 DIAGNOSIS — Z79899 Other long term (current) drug therapy: Secondary | ICD-10-CM | POA: Insufficient documentation

## 2018-05-25 DIAGNOSIS — Z91010 Allergy to peanuts: Secondary | ICD-10-CM | POA: Diagnosis not present

## 2018-05-25 DIAGNOSIS — F70 Mild intellectual disabilities: Secondary | ICD-10-CM | POA: Diagnosis not present

## 2018-05-25 HISTORY — PX: FLEXIBLE SIGMOIDOSCOPY: SHX5431

## 2018-05-25 SURGERY — SIGMOIDOSCOPY, FLEXIBLE
Anesthesia: General

## 2018-05-25 MED ORDER — SODIUM CHLORIDE 0.9 % IV SOLN
INTRAVENOUS | Status: DC
  Administered 2018-05-25: 10:00:00 via INTRAVENOUS

## 2018-05-25 NOTE — Progress Notes (Signed)
Dr. Allegra Lai notified that patient didn't take a bowel prep Large tap water enema ordered and given after explanation to patient and he agrees. Good results

## 2018-05-25 NOTE — Op Note (Signed)
[facility] Gastroenterology Patient Name: Michael Pope Procedure Date: 05/25/2018 9:34 AM MRN: [id number] Account #: [id number] Date of Birth: 1995/08/27 Admit Type: Outpatient Age: 23 Room: [facility] ENDO ROOM 2 Gender: Male Note Status: Finalized Procedure:            Flexible Sigmoidoscopy Indications:          Rectal hemorrhage Providers:            Toney Reil MD, MD Referring MD:         Sherrie Mustache, MD (Referring MD) Medicines:            No sedation, None Complications:        No immediate complications. Estimated blood loss: None. Procedure:            Pre-Anesthesia Assessment:                       - Prior to the procedure, a History and Physical was                        performed, and patient medications and allergies were                        reviewed. The patient is competent. The risks and                        benefits of the procedure and the sedation options and                        risks were discussed with the patient. All questions                        were answered and informed consent was obtained.                        Patient identification and proposed procedure were                        verified by the physician, the nurse, the                        anesthesiologist, the anesthetist and the technician in                        the pre-procedure area in the procedure room in the                        endoscopy suite. Mental Status Examination: alert and                        oriented. Airway Examination: normal oropharyngeal                        airway and neck mobility. Respiratory Examination:                        clear to auscultation. CV Examination: normal.                        Prophylactic Antibiotics: The patient does not require  prophylactic antibiotics. Prior Anticoagulants: The                        patient has taken no previous anticoagulant or   antiplatelet agents. ASA Grade Assessment: II - A                        patient with mild systemic disease. After reviewing the                        risks and benefits, the patient was deemed in                        satisfactory condition to undergo the procedure. The                        anesthesia plan was to use no sedation or anesthesia.                        Immediately prior to administration of medications, the                        patient was re-assessed for adequacy to receive                        sedatives. The heart rate, respiratory rate, oxygen                        saturations, blood pressure, adequacy of pulmonary                        ventilation, and response to care were monitored                        throughout the procedure. The physical status of the                        patient was re-assessed after the procedure.                       After obtaining informed consent, the scope was passed                        under direct vision. The Colonoscope was introduced                        through the anus and advanced to the the left                        transverse colon. The flexible sigmoidoscopy was                        accomplished without difficulty. The patient tolerated                        the procedure well. The quality of the bowel                        preparation was fair. Findings:      The perianal and digital rectal examinations were normal. Pertinent  negatives include normal sphincter tone and no palpable rectal lesions.      The entire examined colon appeared normal.      Non-bleeding internal hemorrhoids were found during retroflexion. The       hemorrhoids were medium-sized. Impression:           - Preparation of the colon was fair.                       - The entire examined colon is normal.                       - No specimens collected. Recommendation:       - Discharge patient to home (ambulatory).                        - Resume regular diet today. Procedure Code(s):    --- Professional ---                       [id number], Sigmoidoscopy, flexible; diagnostic, including                        collection of specimen(s) by brushing or washing, when                        performed (separate procedure) Diagnosis Code(s):    --- Professional ---                       K62.5, Hemorrhage of anus and rectum CPT copyright 2018 American Medical Association. All rights reserved. The codes documented in this report are preliminary and upon coder review may  be revised to meet current compliance requirements. Dr. Libby Mawonini Davonda Ausley Toney Reilohini Reddy Shawndrea Rutkowski MD, MD 05/25/2018 9:51:55 AM This report has been signed electronically. Number of Addenda: 0 Note Initiated On: 05/25/2018 9:34 AM Total Procedure Duration: 0 hours 6 minutes 9 seconds       [facility]

## 2018-05-25 NOTE — Anesthesia Preprocedure Evaluation (Signed)
Anesthesia Evaluation  Patient identified by MRN, date of birth, ID band Patient awake    Reviewed: Allergy & Precautions, H&P , NPO status , Patient's Chart, lab work & pertinent test results, reviewed documented beta blocker date and time   Airway Mallampati: II   Neck ROM: full    Dental  (+) Poor Dentition   Pulmonary neg pulmonary ROS, Current Smoker,    Pulmonary exam normal        Cardiovascular Exercise Tolerance: Good negative cardio ROS Normal cardiovascular exam Rhythm:regular Rate:Normal     Neuro/Psych Seizures -, Well Controlled,  PSYCHIATRIC DISORDERS Anxiety Hx. 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 No pertinent surgical history. BMI    Body Mass Index:  23.68 kg/m     Reproductive/Obstetrics negative OB ROS                             Anesthesia Physical Anesthesia Plan  ASA: II  Anesthesia Plan: General   Post-op Pain Management:    Induction:   PONV Risk Score and Plan:   Airway Management Planned:   Additional Equipment:   Intra-op Plan:   Post-operative Plan:   Informed Consent: I have reviewed the patients History and Physical, chart, labs and discussed the procedure including the risks, benefits and alternatives for the proposed anesthesia with the patient or authorized representative who has indicated his/her understanding and acceptance.   Dental Advisory Given  Plan Discussed with:  CRNA  Anesthesia Plan Comments:         Anesthesia Quick Evaluation

## 2018-05-25 NOTE — H&P (Signed)
Arlyss Repressohini R Vanga, MD [address]  [address]  [place], [place] [address]  Main: [phone]  Fax: [phone] Pager: [phone]  Primary Care Physician:  Sherrie MustacheJadali, Fayegh, MD Primary Gastroenterologist:  Dr. Arlyss Repressohini R Vanga  Pre-Procedure History & Physical: HPI:  Michael Pope is a 23 y.o. male is here for an flexible sigmoidoscopy.   Past Medical History:  Diagnosis Date  . Conduct disorder   . Disruptive mood dysregulation disorder (HCC)   . Mild intellectual disability   . PTSD (post-traumatic stress disorder)   . Seizures (HCC)     History reviewed. No pertinent surgical history.  Prior to Admission medications   Medication Sig Start Date End Date Taking? Authorizing Provider  divalproex (DEPAKOTE) 125 MG DR tablet Take 1 tablet (125 mg total) by mouth 2 (two) times daily. 10/05/17 10/05/18 Yes Veronese, [place], MD  FLUoxetine (PROZAC) 20 MG capsule Take 1 capsule (20 mg total) by mouth daily. 05/12/16  Yes Irean HongSung, Jade J, MD  levETIRAcetam (KEPPRA) 500 MG tablet Take 1 tablet (500 mg total) by mouth 2 (two) times daily. 10/05/17  Yes Loleta RoseForbach, Cory, MD  QUEtiapine (SEROQUEL) 100 MG tablet Take 100 mg by mouth 2 (two) times daily.    Yes [provider]  baclofen (LIORESAL) 10 MG tablet Take 1 tablet (10 mg total) by mouth 3 (three) times daily. 04/17/16   Beers, Charmayne Sheerharles M, PA-C  EPINEPHrine 0.3 mg/0.3 mL IJ SOAJ injection Inject 0.3 mg into the muscle as needed (anaphylaxis/allergic reaction).     [provider]  guanFACINE (TENEX) 2 MG tablet Take 2 mg by mouth 2 (two) times daily.    [provider]  lamoTRIgine (LAMICTAL) 200 MG tablet Take 1 tablet (200 mg total) by mouth 2 (two) times daily. 07/31/17 07/31/18  Altamese DillingVachhani, Vaibhavkumar, MD  lamoTRIgine (LAMICTAL) 25 MG tablet Take 2 tablets (50 mg total) by mouth 2 (two) times daily for 15 days. Along with 200 mg , as a total of 225 mg oral BID 10/02/17 10/17/17  Sharman CheekStafford, Phillip, MD  prazosin  (MINIPRESS) 2 MG capsule Take 2 mg by mouth at bedtime.     [provider]    Allergies as of 04/25/2018 - Review Complete 04/25/2018  Allergen Reaction Noted  . Peanut-containing drug products Swelling 04/28/2013  . Almond oil  01/02/2015  . Fish allergy  01/02/2015  . Other  09/08/2016  . Peanuts [peanut oil]  01/02/2015  . Pollen extract  01/02/2015    Family History  Problem Relation Age of Onset  . CAD Father     Social History   Socioeconomic History  . Marital status: Single    Spouse name: Not on file  . Number of children: Not on file  . Years of education: Not on file  . Highest education level: Not on file  Occupational History  . Not on file  Social Needs  . Financial resource strain: Patient refused  . Food insecurity:    Worry: Never true    Inability: Never true  . Transportation needs:    Medical: No    Non-medical: No  Tobacco Use  . Smoking status: Current Every Day Smoker    Types: E-cigarettes  . Smokeless tobacco: Never Used  Substance and Sexual Activity  . Alcohol use: No  . Drug use: No  . Sexual activity: Never  Lifestyle  . Physical activity:    Days per week: 5 days    Minutes per session: 60 min  .  Stress: Only a little  Relationships  . Social connections:    Talks on phone: Patient refused    Gets together: Patient refused    Attends religious service: Patient refused    Active member of club or organization: Patient refused    Attends meetings of clubs or organizations: Patient refused    Relationship status: Patient refused  . Intimate partner violence:    Fear of current or ex partner: Patient refused    Emotionally abused: Patient refused    Physically abused: Patient refused    Forced sexual activity: Patient refused  Other Topics Concern  . Not on file  Social History Narrative  . Not on file    Review of Systems: See HPI, otherwise negative ROS  Physical Exam: BP 130/61   Pulse 63   Temp (!) 97.1  F (36.2 C) (Tympanic)   Resp 16   Ht 5\' 10"  (1.778 m)   Wt 74.8 kg   SpO2 100%   BMI 23.68 kg/m  General:   Alert,  pleasant and cooperative in NAD Head:  Normocephalic and atraumatic. Neck:  Supple; no masses or thyromegaly. Lungs:  Clear throughout to auscultation.    Heart:  Regular rate and rhythm. Abdomen:  Soft, nontender and nondistended. Normal bowel sounds, without guarding, and without rebound.   Neurologic:  Alert and  oriented x4;  grossly normal neurologically.  Impression/Plan: Michael Pope is here for an flexible sigmoidoscopy to be performed for rectal bleeding, no sedation as patient does not have a ride  Risks, benefits, limitations, and alternatives regarding  flexible sigmoidoscopy have been reviewed with the patient.  Questions have been answered.  All parties agreeable.   Lannette Donath, MD  05/25/2018, 9:37 AM

## 2018-05-25 NOTE — Anesthesia Post-op Follow-up Note (Signed)
Anesthesia QCDR form completed.        

## 2020-05-05 ENCOUNTER — Ambulatory Visit: Payer: Medicaid Other | Attending: Internal Medicine

## 2020-05-05 DIAGNOSIS — Z23 Encounter for immunization: Secondary | ICD-10-CM

## 2020-05-05 NOTE — Progress Notes (Signed)
   Covid-19 Vaccination Clinic  Name:  Michael Pope    MRN: [id number] DOB: 1995/08/03  05/05/2020  Mr. Lerner was observed post Covid-19 immunization for 30 minutes based on pre-vaccination screening without incident. He was provided with Vaccine Information Sheet and instruction to access the V-Safe system.   Mr. Nair was instructed to call 911 with any severe reactions post vaccine: Marland Kitchen Difficulty breathing  . Swelling of face and throat  . A fast heartbeat  . A bad rash all over body  . Dizziness and weakness   Immunizations Administered    Name Date Dose VIS Date Route   Moderna COVID-19 Vaccine 05/05/2020 11:36 AM 0.5 mL 03/05/2020 Intramuscular   Manufacturer: Moderna   Lot: [id number]   NDC: 56213-086-57

## 2020-05-06 ENCOUNTER — Ambulatory Visit: Payer: Medicaid Other

## 2020-06-02 ENCOUNTER — Ambulatory Visit: Payer: Medicaid Other

## 2020-06-05 ENCOUNTER — Ambulatory Visit: Payer: Medicaid Other

## 2021-08-20 ENCOUNTER — Emergency Department
Admission: EM | Admit: 2021-08-20 | Discharge: 2021-08-20 | Disposition: A | Discharge status: Home / Self Care | Payer: Medicaid Other | Attending: Emergency Medicine | Admitting: Emergency Medicine

## 2021-08-20 ENCOUNTER — Emergency Department: Payer: Medicaid Other

## 2021-08-20 ENCOUNTER — Other Ambulatory Visit: Payer: Self-pay

## 2021-08-20 DIAGNOSIS — Z20822 Contact with and (suspected) exposure to covid-19: Secondary | ICD-10-CM | POA: Diagnosis not present

## 2021-08-20 DIAGNOSIS — R079 Chest pain, unspecified: Secondary | ICD-10-CM | POA: Diagnosis present

## 2021-08-20 LAB — CBC
HCT: 39.4 % (ref 39.0–52.0)
Hemoglobin: 13.2 g/dL (ref 13.0–17.0)
MCH: 29.7 pg (ref 26.0–34.0)
MCHC: 33.5 g/dL (ref 30.0–36.0)
MCV: 88.5 fL (ref 80.0–100.0)
Platelets: 203 10*3/uL (ref 150–400)
RBC: 4.45 MIL/uL (ref 4.22–5.81)
RDW: 12 % (ref 11.5–15.5)
WBC: 5 10*3/uL (ref 4.0–10.5)
nRBC: 0 % (ref 0.0–0.2)

## 2021-08-20 LAB — BASIC METABOLIC PANEL
Anion gap: 11 (ref 5–15)
BUN: 12 mg/dL (ref 6–20)
CO2: 21 mmol/L — ABNORMAL LOW (ref 22–32)
Calcium: 10 mg/dL (ref 8.9–10.3)
Chloride: 107 mmol/L (ref 98–111)
Creatinine, Ser: 1.02 mg/dL (ref 0.61–1.24)
GFR, Estimated: >60 mL/min (ref >60)
Glucose, Bld: 102 mg/dL — ABNORMAL HIGH (ref 70–99)
Potassium: 3.7 mmol/L (ref 3.5–5.1)
Sodium: 139 mmol/L (ref 135–145)

## 2021-08-20 LAB — RESP PANEL BY RT-PCR (FLU A&B, COVID) ARPGX2
Influenza A by PCR: NEGATIVE
Influenza B by PCR: NEGATIVE
SARS Coronavirus 2 by RT PCR: NEGATIVE

## 2021-08-20 LAB — TROPONIN I (HIGH SENSITIVITY): Troponin I (High Sensitivity): 3 ng/L (ref <18)

## 2021-08-20 MED ORDER — KETOROLAC TROMETHAMINE 30 MG/ML IJ SOLN
15.0000 mg | Freq: Once | INTRAMUSCULAR | Status: AC
  Administered 2021-08-20: 15 mg via INTRAVENOUS
  Filled 2021-08-20: qty 1

## 2021-08-20 MED ORDER — ACETAMINOPHEN 500 MG PO TABS
1000.0000 mg | ORAL_TABLET | Freq: Once | ORAL | Status: AC
  Administered 2021-08-20: 1000 mg via ORAL
  Filled 2021-08-20: qty 2

## 2021-08-20 NOTE — ED Triage Notes (Signed)
Pt BIB [facility] from group home C/O chest pain X several weeks.  ?

## 2021-08-20 NOTE — ED Provider Notes (Signed)
? ?[facility] ?Provider Note ? ? ? Event Date/Time  ? First MD Initiated Contact with Patient 08/20/21 1017   ?  (approximate) ? ? ?History  ? ?Chest Pain ? ? ?HPI ? ?Michael Pope is a 26 y.o. male with a past medical history of seizure disorder, PTSD, conduct disorder and mild intellectual disability who presents for evaluation of 2 to 3 weeks of some substernal chest pain.  He states it is there all the time and sometimes worse at night.  It is not positional or exertional.  He has no associated shortness of breath, cough, fevers, back pain, headache, earache, sore throat nausea or vomiting.  States he has had some nonbloody diarrhea in the last couple days.  No urinary symptoms or rash or extremity pain.  He has not tried any medications for symptoms.  No prior similar episodes.  No alleviating aggravating factors.  No history of CAD or PE/DVT.  He does not recall any injuries or falls.  He denies tobacco use, EtOH use or illicit drug use. ? ?  ? ? ?Physical Exam  ?Triage Vital Signs: ?ED Triage Vitals  ?Enc Vitals Group  ?   BP 08/20/21 1011 114/89  ?   Pulse Rate 08/20/21 1011 74  ?   Resp 08/20/21 1011 16  ?   Temp 08/20/21 1011 98.6 ?F (37 ?C)  ?   Temp Source 08/20/21 1011 Oral  ?   SpO2 08/20/21 1011 100 %  ?   Weight 08/20/21 1012 170 lb (77.1 kg)  ?   Height 08/20/21 1012 5\' 10"  (1.778 m)  ?   Head Circumference --   ?   Peak Flow --   ?   Pain Score 08/20/21 1012 6  ?   Pain Loc --   ?   Pain Edu? --   ?   Excl. in GC? --   ? ? ?Most recent vital signs: ?Vitals:  ? 08/20/21 1012 08/20/21 1015  ?BP: 114/89 132/80  ?Pulse: 80 71  ?Resp:  14  ?Temp:    ?SpO2: 100% 100%  ? ? ?General: Awake, no distress.  ?CV:  Good peripheral perfusion.  ?Resp:  Normal effort.  ?Abd:  No distention.  ?Other:   ? ? ?ED Results / Procedures / Treatments  ?Labs ?(all labs ordered are listed, but only abnormal results are displayed) ?Labs Reviewed  ?BASIC METABOLIC PANEL - Abnormal; Notable for the  following components:  ?    Result Value  ? CO2 21 (*)   ? Glucose, Bld 102 (*)   ? All other components within normal limits  ?RESP PANEL BY RT-PCR (FLU A&B, COVID) ARPGX2  ?CBC  ?TROPONIN I (HIGH SENSITIVITY)  ? ? ? ?EKG ? ?ECG is unremarkable for sinus rhythm with a ventricular rate of 73 and nonspecific ST change in lead I and aVL without any other clear evidence of acute ischemia or significant arrhythmia. ? ? ?RADIOLOGY ?Chest reviewed by myself shows no focal consoidation, effusion, edema, pneumothorax or other clear acute thoracic process. I also reviewed radiology interpretation and agree with findings described. ? ? ? ?PROCEDURES: ? ?Critical Care performed: No ? ?.1-3 Lead EKG Interpretation ?Performed by: 10/20/21, MD ?Authorized by: Gilles Chiquito, MD  ? ?  Interpretation: normal   ?  ECG rate assessment: normal   ?  Rhythm: sinus rhythm   ?  Ectopy: none   ?  Conduction: normal   ? ?The patient is  on the cardiac monitor to evaluate for evidence of arrhythmia and/or significant heart rate changes. ? ? ?MEDICATIONS ORDERED IN ED: ?Medications  ?ketorolac (TORADOL) 30 MG/ML injection 15 mg (15 mg Intravenous Given 08/20/21 1105)  ?acetaminophen (TYLENOL) tablet 1,000 mg (1,000 mg Oral Given 08/20/21 1106)  ? ? ? ?IMPRESSION / MDM / ASSESSMENT AND PLAN / ED COURSE  ?I reviewed the triage vital signs and the nursing notes. ?             ?               ? ?Differential diagnosis includes, but is not limited to ACS, myocarditis, pneumonia, bronchitis, pleurisy, pericarditis, costochondritis with a lower suspicion for a PE at this time as patient is PERC negative and a lower suspicion for dissection given absence of clear risk factors overall description of symptoms.  He describes a tight pressure-like pain right of the sternum. ? ?ECG is unremarkable for sinus rhythm with a ventricular rate of 73 and nonspecific ST change in lead I and aVL without any other clear evidence of acute ischemia or  significant arrhythmia.  Given nonelevated troponin obtained greater than 3 hours after symptom onset of a very low suspicion for ACS or myocarditis. ? ?Chest reviewed by myself shows no focal consoidation, effusion, edema, pneumothorax or other clear acute thoracic process. I also reviewed radiology interpretation and agree with findings described. ? ?BMP without any significant electrolyte or metabolic derangements.  CBC shows no leukocytosis or acute anemia. ? ?Given association with some diarrhea and possible patient has a viral pleurisy although he has no nausea or vomiting or any abdominal pain or tenderness on exam to suggest appendicitis, diverticulitis or other immediate life-threatening intra-abdominal process.  He does not significantly dehydrated and have a low suspicion for cholecystitis or pancreatitis. ? ?After some Tylenol and Toradol patient states he is feeling much better.  Unclear etiology for patient's pain at this time although given stable vitals otherwise reassuring exam work-up and low suspicion for immediate life-threatening process I think patient stable for discharge with outpatient follow-up.  He may follow-up with COVID influenza PCR online.  He has no other acute concerns at this time.  Discussed using Tylenol ibuprofen as needed.  Discharged in stable condition.  Strict turn precautions advised and discussed. ? ?  ? ? ?FINAL CLINICAL IMPRESSION(S) / ED DIAGNOSES  ? ?Final diagnoses:  ?Chest pain, unspecified type  ? ? ? ?Rx / DC Orders  ? ?ED Discharge Orders   ? ? None  ? ?  ? ? ? ?Note:  This document was prepared using Dragon voice recognition software and may include unintentional dictation errors. ?  ?Gilles Chiquito, MD ?08/20/21 1122 ? ?

## 2023-07-12 ENCOUNTER — Other Ambulatory Visit (INDEPENDENT_AMBULATORY_CARE_PROVIDER_SITE_OTHER): Payer: Self-pay | Admitting: Pediatrics

## 2023-07-13 ENCOUNTER — Other Ambulatory Visit: Payer: Self-pay

## 2023-07-13 ENCOUNTER — Emergency Department: Payer: MEDICAID

## 2023-07-13 ENCOUNTER — Emergency Department
Admission: EM | Admit: 2023-07-13 | Discharge: 2023-07-13 | Disposition: A | Discharge status: Home / Self Care | Payer: MEDICAID | Attending: Emergency Medicine | Admitting: Emergency Medicine

## 2023-07-13 ENCOUNTER — Encounter: Payer: Self-pay | Admitting: Emergency Medicine

## 2023-07-13 DIAGNOSIS — J101 Influenza due to other identified influenza virus with other respiratory manifestations: Secondary | ICD-10-CM | POA: Insufficient documentation

## 2023-07-13 DIAGNOSIS — B9789 Other viral agents as the cause of diseases classified elsewhere: Secondary | ICD-10-CM

## 2023-07-13 DIAGNOSIS — J029 Acute pharyngitis, unspecified: Secondary | ICD-10-CM

## 2023-07-13 DIAGNOSIS — K1121 Acute sialoadenitis: Secondary | ICD-10-CM | POA: Insufficient documentation

## 2023-07-13 LAB — CBC WITH DIFFERENTIAL/PLATELET
Abs Immature Granulocytes: 0.18 10*3/uL — ABNORMAL HIGH (ref 0.00–0.07)
Basophils Absolute: 0.1 10*3/uL (ref 0.0–0.1)
Basophils Relative: 1 %
Eosinophils Absolute: 0 10*3/uL (ref 0.0–0.5)
Eosinophils Relative: 0 %
HCT: 35.3 % — ABNORMAL LOW (ref 39.0–52.0)
Hemoglobin: 12.2 g/dL — ABNORMAL LOW (ref 13.0–17.0)
Immature Granulocytes: 1 %
Lymphocytes Relative: 28 %
Lymphs Abs: 4 10*3/uL (ref 0.7–4.0)
MCH: 30.4 pg (ref 26.0–34.0)
MCHC: 34.6 g/dL (ref 30.0–36.0)
MCV: 88 fL (ref 80.0–100.0)
Monocytes Absolute: 1.4 10*3/uL — ABNORMAL HIGH (ref 0.1–1.0)
Monocytes Relative: 10 %
Neutro Abs: 8.4 10*3/uL — ABNORMAL HIGH (ref 1.7–7.7)
Neutrophils Relative %: 60 %
Platelets: 182 10*3/uL (ref 150–400)
RBC: 4.01 MIL/uL — ABNORMAL LOW (ref 4.22–5.81)
RDW: 12.1 % (ref 11.5–15.5)
Smear Review: NORMAL
WBC: 14.1 10*3/uL — ABNORMAL HIGH (ref 4.0–10.5)
nRBC: 0 % (ref 0.0–0.2)

## 2023-07-13 LAB — BASIC METABOLIC PANEL
Anion gap: 12 (ref 5–15)
BUN: 8 mg/dL (ref 6–20)
CO2: 23 mmol/L (ref 22–32)
Calcium: 9 mg/dL (ref 8.9–10.3)
Chloride: 100 mmol/L (ref 98–111)
Creatinine, Ser: 1.02 mg/dL (ref 0.61–1.24)
GFR, Estimated: >60 mL/min (ref >60)
Glucose, Bld: 102 mg/dL — ABNORMAL HIGH (ref 70–99)
Potassium: 2.9 mmol/L — ABNORMAL LOW (ref 3.5–5.1)
Sodium: 135 mmol/L (ref 135–145)

## 2023-07-13 LAB — RESP PANEL BY RT-PCR (RSV, FLU A&B, COVID)  RVPGX2
Influenza A by PCR: POSITIVE — AB
Influenza B by PCR: NEGATIVE
Resp Syncytial Virus by PCR: NEGATIVE
SARS Coronavirus 2 by RT PCR: NEGATIVE

## 2023-07-13 LAB — GROUP A STREP BY PCR: Group A Strep by PCR: NOT DETECTED

## 2023-07-13 MED ORDER — DEXAMETHASONE SODIUM PHOSPHATE 10 MG/ML IJ SOLN
10.0000 mg | Freq: Once | INTRAMUSCULAR | Status: AC
Start: 2023-07-13 — End: 2023-07-13
  Administered 2023-07-13: 10 mg via INTRAVENOUS
  Filled 2023-07-13: qty 1

## 2023-07-13 MED ORDER — ACETAMINOPHEN 325 MG PO TABS
650.0000 mg | ORAL_TABLET | Freq: Once | ORAL | Status: AC | PRN
  Administered 2023-07-13: 650 mg via ORAL
  Filled 2023-07-13: qty 2

## 2023-07-13 MED ORDER — OSELTAMIVIR PHOSPHATE 75 MG PO CAPS: 75.0000 mg | ORAL_CAPSULE | Freq: Two times a day (BID) | ORAL | 0 refills | Status: AC

## 2023-07-13 MED ORDER — IOHEXOL 300 MG/ML  SOLN
75.0000 mL | Freq: Once | INTRAMUSCULAR | Status: AC | PRN
  Administered 2023-07-13: 75 mL via INTRAVENOUS

## 2023-07-13 NOTE — ED Provider Notes (Signed)
 [facility] Emergency Department Provider Note     Event Date/Time   First MD Initiated Contact with Patient 07/13/23 1111     (approximate)   History   Sore Throat   HPI  Michael Pope is a 28 y.o. male with a history of adjustment disorder, and mild intellectual disability, presents to the ED with complaint of sore throat.  Patient would endorse several days of symptoms.  He reports it hurts to swallow.  No fevers are reported.  Patient presents with a fever noted at 101.6 F.   Physical Exam   Triage Vital Signs: ED Triage Vitals  Encounter Vitals Group     BP 07/13/23 1000 127/60     Systolic BP Percentile --      Diastolic BP Percentile --      Pulse Rate 07/13/23 1000 94     Resp 07/13/23 1000 18     Temp 07/13/23 1000 (!) 101.6 F (38.7 C)     Temp src --      SpO2 07/13/23 1000 94 %     Weight 07/13/23 1006 140 lb (63.5 kg)     Height 07/13/23 1006 5\' 10"  (1.778 m)     Head Circumference --      Peak Flow --      Pain Score 07/13/23 1006 3     Pain Loc --      Pain Education --      Exclude from Growth Chart --     Most recent vital signs: Vitals:   07/13/23 1304 07/13/23 1308  BP: 133/69   Pulse: 77   Resp: 18   Temp:  98.2 F (36.8 C)  SpO2: 97%     General Awake, no distress. NAD HEENT NCAT. PERRL. EOMI. No rhinorrhea. Mucous membranes are moist.  Uvula is midline and tonsils are flat.  No oropharyngeal lesions appreciated.  Patient with palpable bilateral submandibular parotid glands.  No fluctuance or tenderness is appreciated. CV:  Good peripheral perfusion. RRR RESP:  Normal effort. CTA ABD:  No distention.    ED Results / Procedures / Treatments   Labs (all labs ordered are listed, but only abnormal results are displayed) Labs Reviewed  RESP PANEL BY RT-PCR (RSV, FLU A&B, COVID)  RVPGX2 - Abnormal; Notable for the following components:      Result Value   Influenza A by PCR POSITIVE (*)    All other  components within normal limits  BASIC METABOLIC PANEL - Abnormal; Notable for the following components:   Potassium 2.9 (*)    Glucose, Bld 102 (*)    All other components within normal limits  CBC WITH DIFFERENTIAL/PLATELET - Abnormal; Notable for the following components:   WBC 14.1 (*)    RBC 4.01 (*)    Hemoglobin 12.2 (*)    HCT 35.3 (*)    Neutro Abs 8.4 (*)    Monocytes Absolute 1.4 (*)    Abs Immature Granulocytes 0.18 (*)    All other components within normal limits  GROUP A STREP BY PCR     EKG   RADIOLOGY   CT Soft Tissue Neck W Contrast Result Date: 07/13/2023 CLINICAL DATA:  Epiglottitis or tonsillitis suspected EXAM: CT NECK WITH CONTRAST TECHNIQUE: Multidetector CT imaging of the neck was performed using the standard protocol following the bolus administration of intravenous contrast. RADIATION DOSE REDUCTION: This exam was performed according to the departmental dose-optimization program which includes automated exposure control, adjustment of  the mA and/or kV according to patient size and/or use of iterative reconstruction technique. CONTRAST:  75mL OMNIPAQUE IOHEXOL 300 MG/ML  SOLN COMPARISON:  None Available. FINDINGS: Pharynx and larynx: Bilateral tonsils are normal in appearance. No retropharyngeal effusion. The epiglottis is normal in appearance. Salivary glands: Bilateral parotid glands are normal in appearance. Bilateral submandibular glands and likely the sublingual glands are enlarged and hyperenhancing with surrounding inflammatory fat stranding that extends inferiorly to the level of the sternal notch. Thyroid: Normal. Lymph nodes: Prominent bilateral cervical lymph nodes, favored to be reactive Vascular: Negative. Limited intracranial: Negative. Visualized orbits: Negative. Mastoids and visualized paranasal sinuses: No middle ear or mastoid effusion. Paranasal sinuses are notable for pansinus mucosal thickening with air-fluid levels in the bilateral maxillary  sinuses. Orbits are unremarkable. Skeleton: No acute or aggressive process. Upper chest: Negative. Other: None. IMPRESSION: 1. Findings are worrisome for bilateral submandibular and sublingual sialadenitis. No sialolith. Findings could be seen in the setting of viral sialadenitis. 2. Pansinus mucosal thickening with air-fluid levels in the bilateral maxillary sinuses. Correlate for symptoms of acute sinusitis. Electronically Signed   By: Lorenza Cambridge M.D.   On: 07/13/2023 12:58     PROCEDURES:  Critical Care performed: No  Procedures   MEDICATIONS ORDERED IN ED: Medications  acetaminophen (TYLENOL) tablet 650 mg (650 mg Oral Given 07/13/23 1009)  iohexol (OMNIPAQUE) 300 MG/ML solution 75 mL (75 mLs Intravenous Contrast Given 07/13/23 1240)  dexamethasone (DECADRON) injection 10 mg (10 mg Intravenous Given 07/13/23 1309)     IMPRESSION / MDM / ASSESSMENT AND PLAN / ED COURSE  I reviewed the triage vital signs and the nursing notes.                              Differential diagnosis includes, but is not limited to, COVID, flu, RSV, viral URI, strep pharyngitis, parotitis, angioedema, dental infection, Ludwig's angina  Patient's presentation is most consistent with acute complicated illness / injury requiring diagnostic workup.  Patient's diagnosis is consistent with sore throat, influenza A, and bilateral viral sialadenitis.  Patient with reassuring exam and workup at this time.  No airway compromise or angioedema appreciated.  CT scan did however, confirm bilateral submandibular and sublingual salivary glands likely representing a viral etiology.  No focal abscess noted.  Rapid strep test was negative by PCR.  Viral panel was also positive for influenza A.  Patient is stable without airway compromise for outpatient management.  Patient will be discharged home with prescriptions for Tamiflu. Patient is to follow up with his primary provider as discussed, as needed or otherwise directed.  Patient is given ED precautions to return to the ED for any worsening or new symptoms.     FINAL CLINICAL IMPRESSION(S) / ED DIAGNOSES   Final diagnoses:  Sore throat  Influenza A  Acute viral sialadenitis     Rx / DC Orders   ED Discharge Orders          Ordered    oseltamivir (TAMIFLU) 75 MG capsule  2 times daily        07/13/23 1309             Note:  This document was prepared using Dragon voice recognition software and may include unintentional dictation errors.    Lissa Hoard, PA-C 07/13/23 Brooke Pace    Janith Lima, MD 07/14/23 [phone]

## 2023-07-13 NOTE — ED Triage Notes (Signed)
 Patient to ED via POV for sore throat. Ongoing for a few days. Hurts to swallow. NAD noted. Speaking in full sentences.

## 2023-07-13 NOTE — ED Notes (Signed)
 Group home house manger Nate came to desk to ask when pt is discharged to call to pick pt up.  [phone]

## 2023-07-13 NOTE — Discharge Instructions (Signed)
 You have tested positive for flu.  Your strep test was negative.  Your CT scan shows inflammation of your salivary glands in the jaw and neck.  This viral infection of the salivary glands should resolve on its own.  Take OTC Tylenol and Motrin for fevers.  You may take the Tamiflu for influenza if you are interested.  Follow-up with your primary provider or return to the ED for worsening throat pain.

## 2023-07-21 IMAGING — CR DG CHEST 2V
3 series · 3 of 3 positions shown · non-contrast
Comparison: None.

CLINICAL DATA: Chest pain

EXAM:
CHEST - 2 VIEW

[chest lat (1 of 2)]
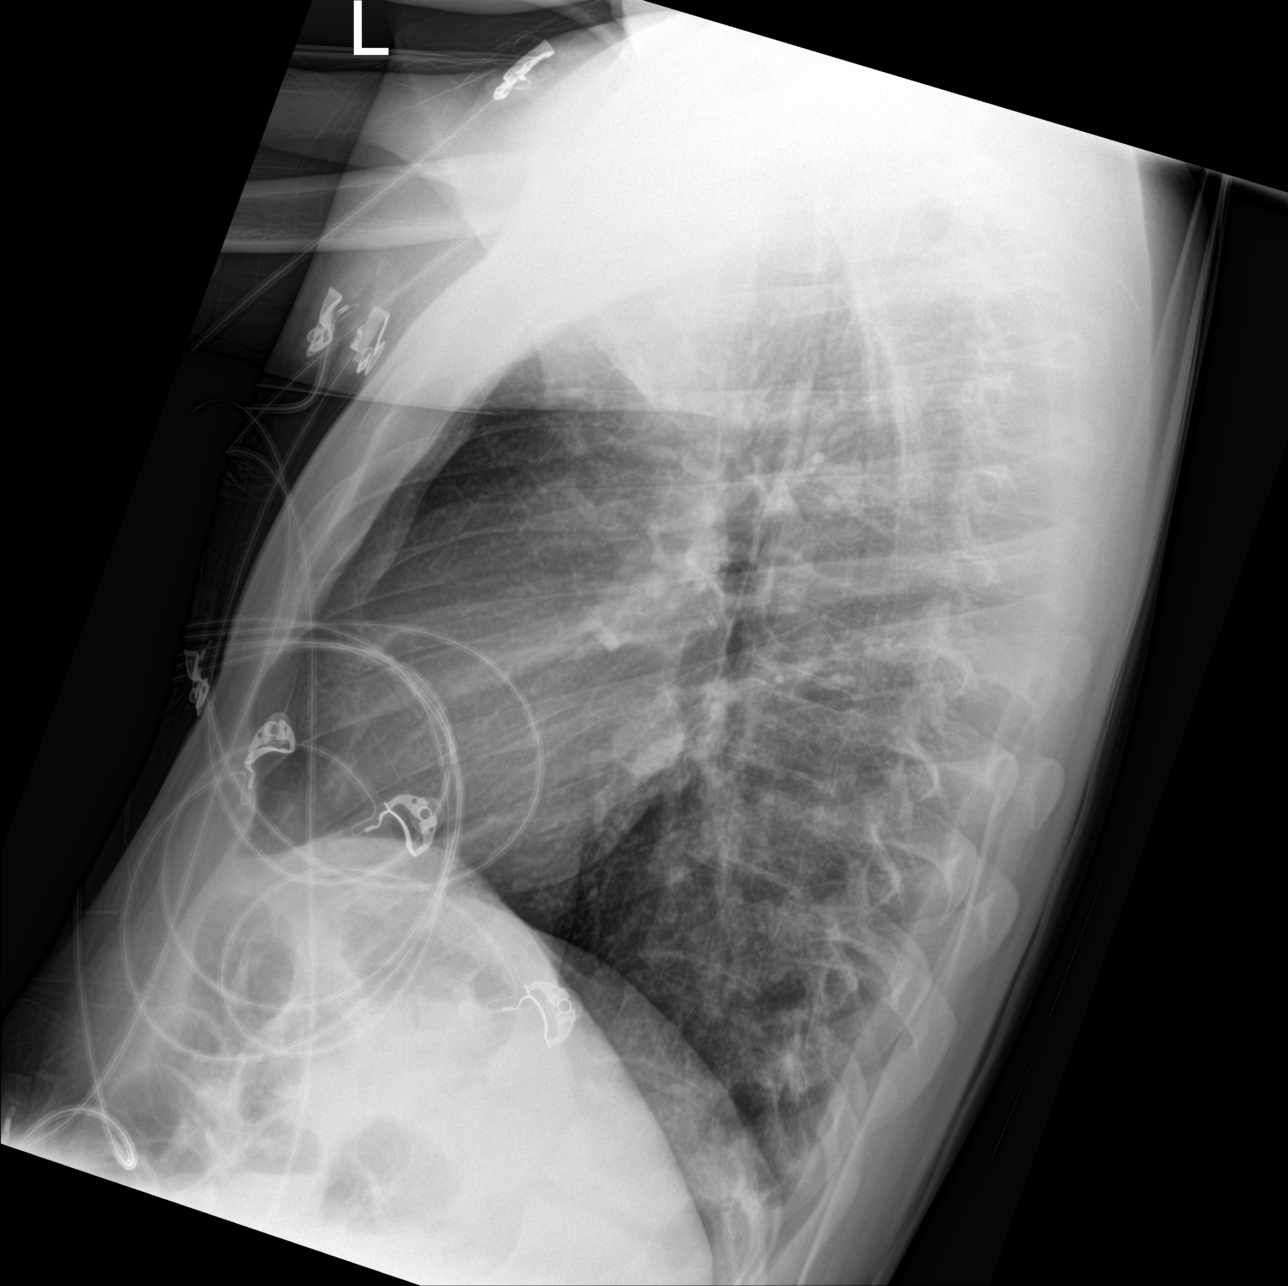

[chest ap]
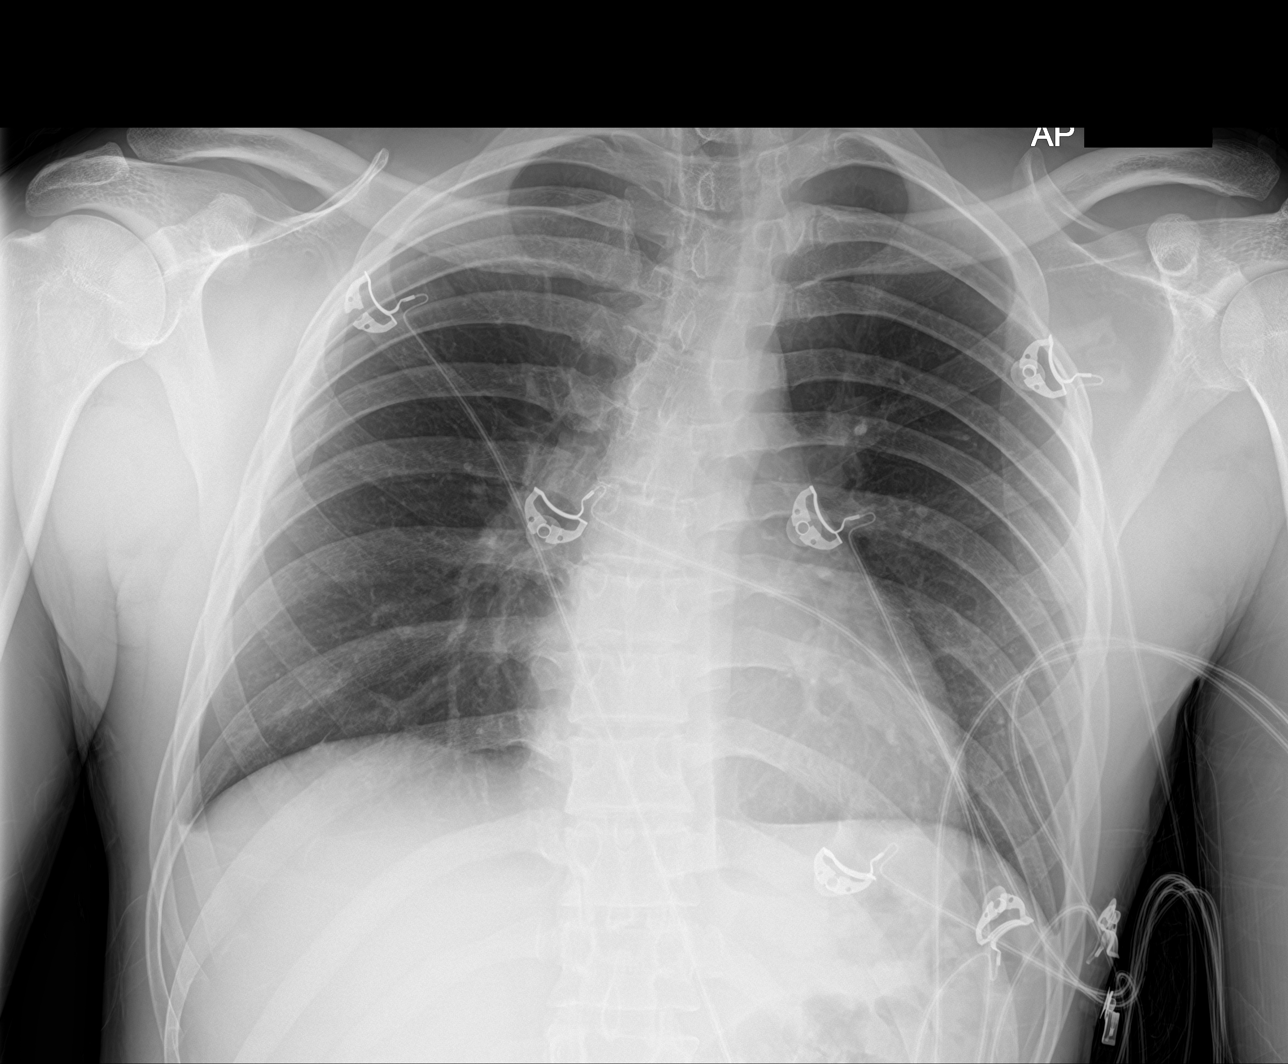

[chest lat (2 of 2)]
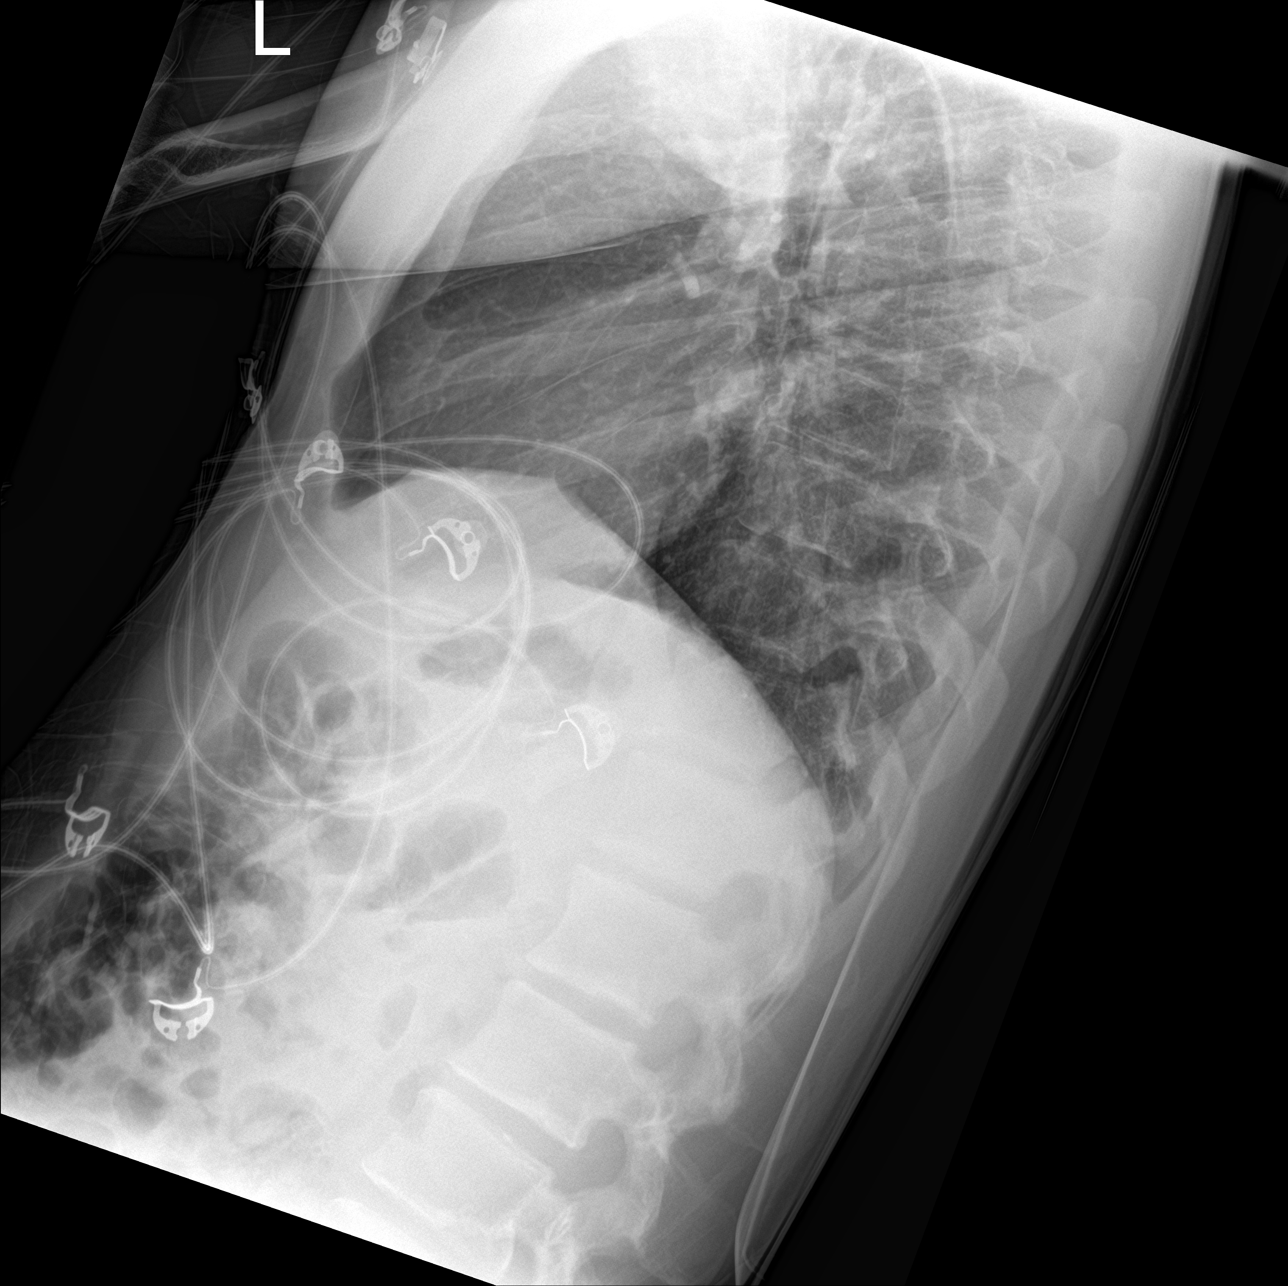

[3 of 3 positions shown; findings below may reference images not displayed]

FINDINGS: The heart size and mediastinal contours are within normal limits.
Both lungs are clear. The visualized skeletal structures are
unremarkable.
IMPRESSION: No active cardiopulmonary disease.

## 2024-02-01 ENCOUNTER — Ambulatory Visit: Payer: MEDICAID | Admitting: Family

## 2024-03-28 ENCOUNTER — Ambulatory Visit: Payer: MEDICAID | Admitting: General Practice

## 2024-04-03 ENCOUNTER — Ambulatory Visit: Payer: MEDICAID | Admitting: Podiatry

## 2024-04-03 DIAGNOSIS — M21619 Bunion of unspecified foot: Secondary | ICD-10-CM | POA: Diagnosis not present

## 2024-04-03 DIAGNOSIS — M2042 Other hammer toe(s) (acquired), left foot: Secondary | ICD-10-CM | POA: Diagnosis not present

## 2024-04-03 NOTE — Progress Notes (Signed)
 Subjective:  Patient ID: Michael Pope, male    DOB: 1995/06/05,  MRN: [id number]  Chief Complaint  Patient presents with   Toe Pain    28 y.o. male presents with the above complaint.  Patient presents with left moderate bunion deformity with second digit hammertoe contracture he states is causing him some discomfort just wanted to get it evaluated.  Has not seen MRIs prior to seeing me.  Is no longer bothering him.  He has not made any shoe gear modification.  Pain scale 0 out of 10 dull aching nature.  It was 7 out of 10 when it flared up   Review of Systems: Negative except as noted in the HPI. Denies N/V/F/Ch.  Past Medical History:  Diagnosis Date   Conduct disorder    Disruptive mood dysregulation disorder    Mild intellectual disability    PTSD (post-traumatic stress disorder)    Seizures (HCC)     Current Outpatient Medications:    ARIPiprazole (ABILIFY) 10 MG tablet, Take 10 mg by mouth daily., Disp: , Rfl:    baclofen  (LIORESAL ) 10 MG tablet, Take 1 tablet (10 mg total) by mouth 3 (three) times daily., Disp: 30 tablet, Rfl: 0   Cholecalciferol (VITAMIN D-1000 MAX ST) 25 MCG (1000 UT) tablet, Take by mouth., Disp: , Rfl:    divalproex  (DEPAKOTE ) 125 MG DR tablet, Take 1 tablet (125 mg total) by mouth 2 (two) times daily., Disp: 60 tablet, Rfl: 1   EPINEPHrine 0.3 mg/0.3 mL IJ SOAJ injection, Inject 0.3 mg into the muscle as needed (anaphylaxis/allergic reaction). , Disp: , Rfl:    FLUoxetine  (PROZAC ) 20 MG capsule, Take 1 capsule (20 mg total) by mouth daily., Disp: 30 capsule, Rfl: 0   guanFACINE (TENEX) 2 MG tablet, Take 2 mg by mouth 2 (two) times daily., Disp: , Rfl:    lamoTRIgine  (LAMICTAL ) 200 MG tablet, Take 1 tablet (200 mg total) by mouth 2 (two) times daily., Disp: 60 tablet, Rfl: 2   lamoTRIgine  (LAMICTAL ) 25 MG tablet, Take 2 tablets (50 mg total) by mouth 2 (two) times daily for 15 days. Along with 200 mg , as a total of 225 mg oral BID, Disp: 60 tablet,  Rfl: 0   levETIRAcetam  (KEPPRA ) 500 MG tablet, Take 1 tablet (500 mg total) by mouth 2 (two) times daily., Disp: 60 tablet, Rfl: 2   prazosin  (MINIPRESS ) 2 MG capsule, Take 2 mg by mouth at bedtime. , Disp: , Rfl:    QUEtiapine  (SEROQUEL ) 100 MG tablet, Take 100 mg by mouth 2 (two) times daily. , Disp: , Rfl:   Social History   Tobacco Use  Smoking Status Every Day   Types: E-cigarettes  Smokeless Tobacco Never    Allergies  Allergen Reactions   Peanut-Containing Drug Products Swelling   Almond Oil    Fish Allergy    Other     Other reaction(s): Unknown Almond, fish, peanuts   Peanuts [Peanut Oil]    Pollen Extract    Objective:  There were no vitals filed for this visit. There is no height or weight on file to calculate BMI. Constitutional Well developed. Well nourished.  Vascular Dorsalis pedis pulses palpable bilaterally. Posterior tibial pulses palpable bilaterally. Capillary refill normal to all digits.  No cyanosis or clubbing noted. Pedal hair growth normal.  Neurologic Normal speech. Oriented to person, place, and time. Epicritic sensation to light touch grossly present bilaterally.  Dermatologic Nails well groomed and normal in appearance. No open wounds. No  skin lesions.  Orthopedic: Left moderate bunion deformity with severe hallux valgus deformity with severe second digit rigid hammertoe contracture noted.  Pain on palpation mildly.  No open wounds or lesion noted.   Radiographs: None Assessment:   1. Hammertoe of second toe of left foot   2. Bunion    Plan:  Patient was evaluated and treated and all questions answered.  Left moderate to severe bunion deformity with underlying second digit hammertoe contracture - All questions and concerns were discussed with the patient in extensive detail I extensively discussed shoe gear modification offloading padding.  For now his pain is improved.  If there is regression I discussed with him to come see me we can  discuss surgical options in the future he states understanding  No follow-ups on file.

## 2024-04-05 ENCOUNTER — Ambulatory Visit: Payer: MEDICAID | Admitting: Internal Medicine

## 2024-04-09 ENCOUNTER — Ambulatory Visit: Payer: MEDICAID | Admitting: Family Medicine

## 2024-04-16 ENCOUNTER — Encounter: Payer: Self-pay | Admitting: Family Medicine

## 2024-04-16 ENCOUNTER — Ambulatory Visit (INDEPENDENT_AMBULATORY_CARE_PROVIDER_SITE_OTHER): Payer: MEDICAID | Admitting: Family Medicine

## 2024-04-16 VITALS — BP 118/68 | HR 81 | Ht 70.0 in | Wt 184.2 lb

## 2024-04-16 DIAGNOSIS — E559 Vitamin D deficiency, unspecified: Secondary | ICD-10-CM | POA: Insufficient documentation

## 2024-04-16 DIAGNOSIS — R7303 Prediabetes: Secondary | ICD-10-CM | POA: Diagnosis not present

## 2024-04-16 DIAGNOSIS — Z91010 Allergy to peanuts: Secondary | ICD-10-CM | POA: Diagnosis not present

## 2024-04-16 DIAGNOSIS — Z23 Encounter for immunization: Secondary | ICD-10-CM | POA: Diagnosis not present

## 2024-04-16 DIAGNOSIS — F79 Unspecified intellectual disabilities: Secondary | ICD-10-CM | POA: Insufficient documentation

## 2024-04-16 MED ORDER — EPINEPHRINE 0.3 MG/0.3ML IJ SOAJ: 0.3000 mg | INTRAMUSCULAR | 3 refills | Status: DC | PRN

## 2024-04-16 MED ORDER — EPINEPHRINE 0.3 MG/0.3ML IJ SOAJ: 0.3000 mg | INTRAMUSCULAR | 3 refills | Status: AC | PRN

## 2024-04-16 NOTE — Assessment & Plan Note (Signed)
 Taking TSH and free T4, CBC and CMP.

## 2024-04-16 NOTE — Progress Notes (Signed)
 Established Patient Office Visit  Subjective   Patient ID: Michael Pope, male    DOB: July 23, 1995  Age: 28 y.o. MRN: [id number]  Chief Complaint  Patient presents with   Establish Care    HPI Discussed the use of AI scribe software for clinical note transcription with the patient, who gave verbal consent to proceed.  History of Present Illness   Michael Pope is a 28 year old male with seizure disorder,  mood disorder, peanut/fish allergy Intellectual function disorder and marijuana abuse, who presents to establish care,  medication review and management.  He is currently taking aripiprazole 10 mg daily and lamotrigine  100 mg twice daily. There is some confusion regarding his medication list, and the pharmacy used is [facility].  He resides in a group home with seven other residents and reports no issues with sleep, stating he sleeps well through the night. He participates in a day program at [employer] in [place], where activities include fashion shows, outings, and motivational videos. He engages in physical activity through dancing and occasional push-ups.  No history of diabetes, high blood pressure, or cholesterol issues. He has not experienced recent weight changes and reports no current problems or concerns. He previously took a medication for nightmares and currently reports no problems with nightmares. He was also on vitamin D, which has been discontinued.  He has a known peanut allergy but is unsure of its severity, as he has not had a recent allergy test. He avoids peanuts and is uncertain about the need for an EpiPen. He also reports an allergy to fish but enjoys eating shrimp.  He has not had blood work done in approximately three months.       Objective:     BP 118/68   Pulse 81   Ht 5' 10 (1.778 m)   Wt 184 lb 3.2 oz (83.6 kg)   SpO2 95%   BMI 26.43 kg/m    Physical Exam Vitals and nursing note reviewed.  Constitutional:       Appearance: Normal appearance.  HENT:     Head: Normocephalic and atraumatic.  Eyes:     Conjunctiva/sclera: Conjunctivae normal.  Cardiovascular:     Rate and Rhythm: Normal rate and regular rhythm.  Pulmonary:     Effort: Pulmonary effort is normal.     Breath sounds: Normal breath sounds.  Musculoskeletal:     Right lower leg: No edema.     Left lower leg: No edema.  Skin:    General: Skin is warm and dry.  Neurological:     Mental Status: He is alert and oriented to person, place, and time.  Psychiatric:        Mood and Affect: Mood normal.        Behavior: Behavior normal.        Thought Content: Thought content normal.        Judgment: Judgment normal.          No results found for any visits on 04/16/24.    The ASCVD Risk score (Arnett DK, et al., 2019) failed to calculate for the following reasons:   The 2019 ASCVD risk score is only valid for ages 110 to 28    Assessment & Plan:  [facility]ed [facility] at [facility] patient has previously  [facility] care today I needed to call the Vitamin D deficiency Assessment & Plan: Information in his chart suggest he has a vitamin D deficiency.  Will check  vitamin D today.  Orders: -     VITAMIN D 25 Hydroxy (Vit-D Deficiency, Fractures)  Intellectual functioning disability Assessment & Plan: Taking TSH and free T4, CBC and CMP.  Orders: -     TSH + free T4 -     CBC with Differential/Platelet -     CMP14+EGFR  Prediabetes Assessment & Plan: Patient is taking Aripiprazole 10 mg daily.  Increases the risk of DMT2.  Will check A1c.    Orders: -     Hemoglobin A1c  Encounter for immunization -     Flu vaccine trivalent PF, 6mos and older(Flulaval,Afluria,Fluarix,Fluzone)  Allergy to peanuts Assessment & Plan: Will prescribe an EPI pin as frequently peanut allergy is anaphylaxis.   Other orders -     EPINEPHrine; Inject 0.3 mg into the muscle as needed for anaphylaxis.  Dispense: 1 each;  Refill: 3       Return in about 3 months (around 07/15/2024).    Deontray Hunnicutt K Margarit Minshall, MD

## 2024-04-16 NOTE — Assessment & Plan Note (Signed)
 Patient is taking Aripiprazole 10 mg daily.  Increases the risk of DMT2.  Will check A1c.

## 2024-04-16 NOTE — Assessment & Plan Note (Signed)
 Information in his chart suggest he has a vitamin D deficiency.  Will check vitamin D today.

## 2024-04-16 NOTE — Assessment & Plan Note (Signed)
 Will prescribe an EPI pin as frequently peanut allergy is anaphylaxis.

## 2024-04-17 ENCOUNTER — Ambulatory Visit: Payer: Self-pay | Admitting: Family Medicine

## 2024-04-17 LAB — CBC WITH DIFFERENTIAL/PLATELET
Basophils Absolute: 0 x10E3/uL (ref 0.0–0.2)
Basos: 0 %
EOS (ABSOLUTE): 0.2 x10E3/uL (ref 0.0–0.4)
Eos: 3 %
Hematocrit: 43.7 % (ref 37.5–51.0)
Hemoglobin: 14.1 g/dL (ref 13.0–17.7)
Immature Grans (Abs): 0 x10E3/uL (ref 0.0–0.1)
Immature Granulocytes: 0 %
Lymphocytes Absolute: 2.4 x10E3/uL (ref 0.7–3.1)
Lymphs: 33 %
MCH: 30.6 pg (ref 26.6–33.0)
MCHC: 32.3 g/dL (ref 31.5–35.7)
MCV: 95 fL (ref 79–97)
Monocytes Absolute: 0.7 x10E3/uL (ref 0.1–0.9)
Monocytes: 9 %
Neutrophils Absolute: 4 x10E3/uL (ref 1.4–7.0)
Neutrophils: 55 %
Platelets: 187 x10E3/uL (ref 150–450)
RBC: 4.61 x10E6/uL (ref 4.14–5.80)
RDW: 11.8 % (ref 11.6–15.4)
WBC: 7.4 x10E3/uL (ref 3.4–10.8)

## 2024-04-17 LAB — CMP14+EGFR
ALT: 42 IU/L (ref 0–44)
AST: 31 IU/L (ref 0–40)
Albumin: 5.1 g/dL (ref 4.3–5.2)
Alkaline Phosphatase: 97 IU/L (ref 47–123)
BUN/Creatinine Ratio: 8 — ABNORMAL LOW (ref 9–20)
BUN: 7 mg/dL (ref 6–20)
Bilirubin Total: 0.3 mg/dL (ref 0.0–1.2)
CO2: 21 mmol/L (ref 20–29)
Calcium: 10.1 mg/dL (ref 8.7–10.2)
Chloride: 101 mmol/L (ref 96–106)
Creatinine, Ser: 0.89 mg/dL (ref 0.76–1.27)
Globulin, Total: 2.2 g/dL (ref 1.5–4.5)
Glucose: 70 mg/dL (ref 70–99)
Potassium: 4.3 mmol/L (ref 3.5–5.2)
Sodium: 139 mmol/L (ref 134–144)
Total Protein: 7.3 g/dL (ref 6.0–8.5)
eGFR: 121 mL/min/1.73 (ref >59)

## 2024-04-17 LAB — HEMOGLOBIN A1C
Est. average glucose Bld gHb Est-mCnc: 94 mg/dL
Hgb A1c MFr Bld: 4.9 % (ref 4.8–5.6)

## 2024-04-17 LAB — VITAMIN D 25 HYDROXY (VIT D DEFICIENCY, FRACTURES): Vit D, 25-Hydroxy: 31.9 ng/mL (ref 30.0–100.0)

## 2024-04-17 LAB — TSH+FREE T4
Free T4: 1.16 ng/dL (ref 0.82–1.77)
TSH: 0.961 u[IU]/mL (ref 0.450–4.500)

## 2024-04-18 ENCOUNTER — Telehealth: Payer: Self-pay | Admitting: Family Medicine

## 2024-04-18 NOTE — Telephone Encounter (Signed)
 Caregiver for patient walked in and submitted a [employer]-3051 form (PCS). Copy made and placed in back office file. Original given to CMA.  Patients last OV 04/17/24.  Caregiver refused to sign billing form .

## 2024-04-25 ENCOUNTER — Telehealth: Payer: Self-pay | Admitting: Family Medicine

## 2024-04-25 NOTE — Telephone Encounter (Signed)
 Copied from CRM #[id number]. Topic: General - Other >> Apr 25, 2024 10:49 AM Emylou G wrote: Reason for CRM: Viviann Brought caregiver called.. checking status of form dropped off 12/3?  Pls call him when [phone]

## 2024-04-26 NOTE — Telephone Encounter (Signed)
 Can you guys pls check on this form? Call caregiver if it is ready.

## 2024-04-26 NOTE — Telephone Encounter (Signed)
 I have asked Dr Ziglar and she said she does not have it. I have also check the folders that you told me check regarding another patient and I don't see it.

## 2024-04-27 ENCOUNTER — Telehealth: Payer: Self-pay | Admitting: Family Medicine

## 2024-04-27 NOTE — Telephone Encounter (Signed)
 Spoke with patient's caregiver today who said he will bring in the needed form [employer]B-3051 today to have the provider to complete. Last OV 04/16/2024

## 2024-05-01 ENCOUNTER — Ambulatory Visit: Payer: MEDICAID | Admitting: Podiatry

## 2024-05-01 DIAGNOSIS — M2042 Other hammer toe(s) (acquired), left foot: Secondary | ICD-10-CM | POA: Diagnosis not present

## 2024-05-01 DIAGNOSIS — M21619 Bunion of unspecified foot: Secondary | ICD-10-CM

## 2024-05-01 NOTE — Patient Instructions (Signed)
 He does not need to to use conrn pads. D/c corn pads

## 2024-05-01 NOTE — Progress Notes (Signed)
 Subjective:  Patient ID: Michael Pope, male    DOB: 1995/08/29,  MRN: [id number]  Chief Complaint  Patient presents with   Foot Pain    28 y.o. male presents with the above complaint.  Patient presents with left moderate bunion deformity with second digit hammertoe contracture he states is causing him some discomfort just wanted to get it evaluated.  Has not seen MRIs prior to seeing me.  Is no longer bothering him.  He has not made any shoe gear modification.  Pain scale 0 out of 10 dull aching nature.  It was 7 out of 10 when it flared up   Review of Systems: Negative except as noted in the HPI. Denies N/V/F/Ch.  Past Medical History:  Diagnosis Date   Conduct disorder    Disruptive mood dysregulation disorder    Mild intellectual disability    PTSD (post-traumatic stress disorder)    Seizures (HCC)     Current Outpatient Medications:    ARIPiprazole (ABILIFY) 10 MG tablet, Take 10 mg by mouth daily., Disp: , Rfl:    baclofen  (LIORESAL ) 10 MG tablet, Take 1 tablet (10 mg total) by mouth 3 (three) times daily. (Patient not taking: Reported on 04/16/2024), Disp: 30 tablet, Rfl: 0   Cholecalciferol (VITAMIN D -1000 MAX ST) 25 MCG (1000 UT) tablet, Take by mouth. (Patient not taking: Reported on 04/16/2024), Disp: , Rfl:    divalproex  (DEPAKOTE ) 125 MG DR tablet, Take 1 tablet (125 mg total) by mouth 2 (two) times daily. (Patient not taking: Reported on 04/16/2024), Disp: 60 tablet, Rfl: 1   EPINEPHrine  0.3 mg/0.3 mL IJ SOAJ injection, Inject 0.3 mg into the muscle as needed for anaphylaxis., Disp: 1 each, Rfl: 3   FLUoxetine  (PROZAC ) 20 MG capsule, Take 1 capsule (20 mg total) by mouth daily. (Patient not taking: Reported on 04/16/2024), Disp: 30 capsule, Rfl: 0   guanFACINE (TENEX) 2 MG tablet, Take 2 mg by mouth 2 (two) times daily. (Patient not taking: Reported on 04/16/2024), Disp: , Rfl:    lamoTRIgine  (LAMICTAL ) 200 MG tablet, Take 1 tablet (200 mg total) by mouth 2 (two) times  daily., Disp: 60 tablet, Rfl: 2   lamoTRIgine  (LAMICTAL ) 25 MG tablet, Take 2 tablets (50 mg total) by mouth 2 (two) times daily for 15 days. Along with 200 mg , as a total of 225 mg oral BID, Disp: 60 tablet, Rfl: 0   levETIRAcetam  (KEPPRA ) 500 MG tablet, Take 1 tablet (500 mg total) by mouth 2 (two) times daily. (Patient not taking: Reported on 04/16/2024), Disp: 60 tablet, Rfl: 2   prazosin  (MINIPRESS ) 2 MG capsule, Take 2 mg by mouth at bedtime.  (Patient not taking: Reported on 04/16/2024), Disp: , Rfl:    QUEtiapine  (SEROQUEL ) 100 MG tablet, Take 100 mg by mouth 2 (two) times daily.  (Patient not taking: Reported on 04/16/2024), Disp: , Rfl:   Social History   Tobacco Use  Smoking Status Every Day   Types: E-cigarettes  Smokeless Tobacco Never    Allergies  Allergen Reactions   Peanut-Containing Drug Products Swelling   Almond Oil    Fish Allergy    Other     Other reaction(s): Unknown Almond, fish, peanuts   Peanuts [Peanut Oil]    Pollen Extract    Objective:  There were no vitals filed for this visit. There is no height or weight on file to calculate BMI. Constitutional Well developed. Well nourished.  Vascular Dorsalis pedis pulses palpable bilaterally. Posterior tibial pulses palpable  bilaterally. Capillary refill normal to all digits.  No cyanosis or clubbing noted. Pedal hair growth normal.  Neurologic Normal speech. Oriented to person, place, and time. Epicritic sensation to light touch grossly present bilaterally.  Dermatologic Nails well groomed and normal in appearance. No open wounds. No skin lesions.  Orthopedic: Left moderate bunion deformity with severe hallux valgus deformity with severe second digit rigid hammertoe contracture noted.  Pain on palpation mildly.  No open wounds or lesion noted.   Radiographs: None Assessment:   1. Hammertoe of second toe of left foot   2. Bunion     Plan:  Patient was evaluated and treated and all questions  answered.  Left moderate to severe bunion deformity with underlying second digit hammertoe contracture - All questions and concerns were discussed with the patient in extensive detail I extensively discussed shoe gear modification offloading padding.  For now his pain is improved.  If there is regression I discussed with him to come see me we can discuss surgical options in the future he states understanding  No follow-ups on file.

## 2024-06-13 ENCOUNTER — Telehealth (HOSPITAL_BASED_OUTPATIENT_CLINIC_OR_DEPARTMENT_OTHER): Payer: Self-pay

## 2024-06-13 NOTE — Telephone Encounter (Signed)
 Called and left msg for pt regarding message Dr. Ziglar had sent on 05/30/2024:      Michael Pope,  Please drop off another FMLA form so I can get this completed for you.  Best regards, Dr. Ziglar

## 2024-06-15 ENCOUNTER — Encounter: Payer: Self-pay | Admitting: Family Medicine

## 2024-06-15 ENCOUNTER — Ambulatory Visit: Payer: MEDICAID | Admitting: Family Medicine

## 2024-06-15 VITALS — BP 149/90 | HR 99 | Ht 70.0 in | Wt 185.0 lb

## 2024-06-15 DIAGNOSIS — T7840XD Allergy, unspecified, subsequent encounter: Secondary | ICD-10-CM

## 2024-07-16 ENCOUNTER — Ambulatory Visit: Payer: MEDICAID | Admitting: Family Medicine

## 2024-09-11 ENCOUNTER — Ambulatory Visit: Payer: MEDICAID | Admitting: Family Medicine
